# Patient Record
Sex: Male | Born: 1940 | Race: White | Hispanic: No | Smoking: Former smoker
Health system: Southern US, Community
[De-identification: ages and names within clinical notes are randomized; demographics above are authoritative.]

## PROBLEM LIST (undated history)

## (undated) DIAGNOSIS — R569 Unspecified convulsions: Secondary | ICD-10-CM

## (undated) DIAGNOSIS — G20A1 Parkinson's disease without dyskinesia, without mention of fluctuations: Secondary | ICD-10-CM

## (undated) DIAGNOSIS — N390 Urinary tract infection, site not specified: Secondary | ICD-10-CM

## (undated) DIAGNOSIS — F329 Major depressive disorder, single episode, unspecified: Secondary | ICD-10-CM

## (undated) DIAGNOSIS — F0390 Unspecified dementia without behavioral disturbance: Secondary | ICD-10-CM

## (undated) DIAGNOSIS — I10 Essential (primary) hypertension: Secondary | ICD-10-CM

## (undated) DIAGNOSIS — G20 Parkinson's disease: Secondary | ICD-10-CM

## (undated) DIAGNOSIS — F32A Depression, unspecified: Secondary | ICD-10-CM

## (undated) HISTORY — PX: HIP FRACTURE SURGERY: SHX118

---

## 2011-08-07 ENCOUNTER — Encounter: Payer: Self-pay | Admitting: Emergency Medicine

## 2011-08-07 ENCOUNTER — Emergency Department (HOSPITAL_COMMUNITY): Payer: MEDICARE

## 2011-08-07 ENCOUNTER — Emergency Department (HOSPITAL_COMMUNITY)
Admission: EM | Admit: 2011-08-07 | Discharge: 2011-08-07 | Disposition: A | Discharge status: Home / Self Care | Payer: MEDICARE | Attending: Emergency Medicine | Admitting: Emergency Medicine

## 2011-08-07 DIAGNOSIS — S0003XA Contusion of scalp, initial encounter: Secondary | ICD-10-CM | POA: Insufficient documentation

## 2011-08-07 DIAGNOSIS — S0083XA Contusion of other part of head, initial encounter: Secondary | ICD-10-CM

## 2011-08-07 DIAGNOSIS — S0990XA Unspecified injury of head, initial encounter: Secondary | ICD-10-CM | POA: Insufficient documentation

## 2011-08-07 DIAGNOSIS — G40909 Epilepsy, unspecified, not intractable, without status epilepticus: Secondary | ICD-10-CM | POA: Insufficient documentation

## 2011-08-07 DIAGNOSIS — IMO0002 Reserved for concepts with insufficient information to code with codable children: Secondary | ICD-10-CM | POA: Insufficient documentation

## 2011-08-07 DIAGNOSIS — S0181XA Laceration without foreign body of other part of head, initial encounter: Secondary | ICD-10-CM

## 2011-08-07 DIAGNOSIS — Z79899 Other long term (current) drug therapy: Secondary | ICD-10-CM | POA: Insufficient documentation

## 2011-08-07 DIAGNOSIS — G20 Parkinson's disease: Secondary | ICD-10-CM | POA: Insufficient documentation

## 2011-08-07 DIAGNOSIS — R51 Headache: Secondary | ICD-10-CM | POA: Insufficient documentation

## 2011-08-07 DIAGNOSIS — G20A1 Parkinson's disease without dyskinesia, without mention of fluctuations: Secondary | ICD-10-CM | POA: Insufficient documentation

## 2011-08-07 DIAGNOSIS — S0180XA Unspecified open wound of other part of head, initial encounter: Secondary | ICD-10-CM | POA: Insufficient documentation

## 2011-08-07 DIAGNOSIS — W010XXA Fall on same level from slipping, tripping and stumbling without subsequent striking against object, initial encounter: Secondary | ICD-10-CM | POA: Insufficient documentation

## 2011-08-07 DIAGNOSIS — F3289 Other specified depressive episodes: Secondary | ICD-10-CM | POA: Insufficient documentation

## 2011-08-07 DIAGNOSIS — F329 Major depressive disorder, single episode, unspecified: Secondary | ICD-10-CM | POA: Insufficient documentation

## 2011-08-07 HISTORY — DX: Major depressive disorder, single episode, unspecified: F32.9

## 2011-08-07 HISTORY — DX: Parkinson's disease: G20

## 2011-08-07 HISTORY — DX: Depression, unspecified: F32.A

## 2011-08-07 HISTORY — DX: Parkinson's disease without dyskinesia, without mention of fluctuations: G20.A1

## 2011-08-07 HISTORY — DX: Unspecified convulsions: R56.9

## 2011-08-07 NOTE — ED Provider Notes (Signed)
History    70yM presenting after fall. Mechanical. Lost footing getting out of bed. Struck night stand with head. No loc. Minor HA. Denies neck or back pain. No acute visual complaints. No change in mental status per daughter. No acute numbness, weakness or loss of strength. No cp or sob. No abdominal pain or nv/. Forehead lac. Denies use of blood thinning medications.  CSN: [id number] Arrival date & time: 08/07/2011  1:31 AM   First MD Initiated Contact with Patient 08/07/11 0138      Chief Complaint  Patient presents with  . Fall    (Consider location/radiation/quality/duration/timing/severity/associated sxs/prior treatment) HPI  Past Medical History  Diagnosis Date  . Parkinson disease   . Seizures   . Depression     No past surgical history on file.  No family history on file.  History  Substance Use Topics  . Smoking status: Not on file  . Smokeless tobacco: Not on file  . Alcohol Use:       Review of Systems   Review of symptoms negative unless otherwise noted in HPI.   Allergies  Review of patient's allergies indicates no known allergies.  Home Medications   Current Outpatient Rx  Name Route Sig Dispense Refill  . CARBIDOPA-LEVODOPA 25-100 MG PO TABS Oral Take 1 tablet by mouth 4 (four) times daily.      Marland Kitchen DOCUSATE SODIUM 100 MG PO CAPS Oral Take 100 mg by mouth daily.      Marland Kitchen ENTACAPONE 200 MG PO TABS Oral Take 200 mg by mouth 4 (four) times daily.      Marland Kitchen ESCITALOPRAM OXALATE 20 MG PO TABS Oral Take 20 mg by mouth daily.      Marland Kitchen FERROUS SULFATE 325 (65 FE) MG PO TABS Oral Take 325 mg by mouth 2 (two) times daily.      Marland Kitchen MAGNESIUM HYDROXIDE 400 MG/5ML PO SUSP Oral Take 30 mLs by mouth daily as needed. For constipation     . HCA SUPER THERAVITE-M PO Oral Take 1 tablet by mouth daily.      Marland Kitchen POTASSIUM CHLORIDE CRYS CR 20 MEQ PO TBCR Oral Take 20 mEq by mouth daily.      . TOPIRAMATE 50 MG PO TABS Oral Take 50 mg by mouth 2 (two) times daily.      Marland Kitchen  VITAMIN D (ERGOCALCIFEROL) 50000 UNITS PO CAPS Oral Take 50,000 Units by mouth every 7 (seven) days. On mondays       BP 120/64  Pulse 62  Temp(Src) 98.3 F (36.8 C) (Oral)  Resp 20  SpO2 100%  Physical Exam  Nursing note and vitals reviewed. Constitutional: He appears well-developed and well-nourished. No distress.  HENT:  Right Ear: External ear normal.  Left Ear: External ear normal.       2cm horizontal lac R forehead above eyebrow. No active bleeding. Superficial abrasions and contusions to face.  Eyes: Conjunctivae are normal. Pupils are equal, round, and reactive to light. Right eye exhibits no discharge. Left eye exhibits no discharge.  Neck: Neck supple.  Cardiovascular: Normal rate, regular rhythm and normal heart sounds.  Exam reveals no gallop and no friction rub.   Pulmonary/Chest: Effort normal and breath sounds normal. No respiratory distress.  Abdominal: Soft. He exhibits no distension. There is no tenderness.  Musculoskeletal: He exhibits no edema and no tenderness.       No midline spinal tenderness  Neurological: He is alert. No cranial nerve deficit. He exhibits normal muscle tone.  Skin: Skin is warm and dry.  Psychiatric: He has a normal mood and affect. His behavior is normal. Thought content normal.    ED Course  Procedures (including critical care time)  LACERATION REPAIR Performed by: Raeford Razor Authorized by: Raeford Razor Consent: Verbal consent obtained. Risks and benefits: risks, benefits and alternatives were discussed Consent given by: patient Patient identity confirmed: provided demographic data Prepped and Draped in normal sterile fashion Wound explored  Laceration Location: R forehead  Laceration Length: 2 cm  No Foreign Bodies seen or palpated  Anesthesia: local infiltration  Local anesthetic: lidocaine 2% w/\epinephrine  Anesthetic total: 2 ml  Irrigation method: syringe Amount of cleaning: standard  Skin closure: 5-0  monocryl Number of sutures: 4  Technique: simple interupted  Patient tolerance: Patient tolerated the procedure well with no immediate complications.  Labs Reviewed - No data to display Ct Head Wo Contrast  08/07/2011  *RADIOLOGY REPORT*  Clinical Data:  Status post fall; headache and neck pain.  CT HEAD WITHOUT CONTRAST AND CT CERVICAL SPINE WITHOUT CONTRAST  Technique:  Multidetector CT imaging of the head and cervical spine was performed following the standard protocol without intravenous contrast.  Multiplanar CT image reconstructions of the cervical spine were also generated.  Comparison: None  CT HEAD  Findings: There is no evidence of acute infarction, mass lesion, or intra- or extra-axial hemorrhage on CT.  Prominence of the ventricles and sulci reflects mild cortical volume loss.  Mild periventricular white matter change likely reflects small vessel ischemic microangiopathy.  Mild cerebellar atrophy is noted.  Decreased attenuation within the right pons may reflect a small chronic lacunar infarct.  The basal ganglia are unremarkable in appearance.  The cerebral hemispheres demonstrate grossly normal gray-white differentiation. No mass effect or midline shift is seen.  There is no evidence of fracture; visualized osseous structures are unremarkable in appearance.  The orbits are within normal limits. A mucus retention cyst or polyp is noted within the left maxillary sinus; the remaining paranasal sinuses and mastoid air cells are well-aerated.  Mild soft tissue swelling is noted overlying the left frontal calvarium.  IMPRESSION:  1.  No evidence of traumatic intracranial injury or fracture. 2.  Mild soft tissue swelling overlying the left frontal calvarium. 3.  Mild cortical volume loss and scattered small vessel ischemic microangiopathy. 4.  Possible small chronic lacunar infarct within the right pons. 5.  Mucus retention cyst or polyp within the left maxillary sinus.  CT CERVICAL SPINE  Findings:  There is no evidence of fracture or subluxation. Vertebral bodies demonstrate normal height and alignment.  There is mild narrowing of the intervertebral disc spaces at C4-C5 and C6- C7, with small anterior and posterior disc osteophyte complexes. Mild vacuum phenomenon is noted at C5-C6.  Prevertebral soft tissues are within normal limits.  Mild degenerative change is noted about the dens.  The visualized portions of the thyroid gland are unremarkable in appearance.  The visualized lung apices are clear.  No significant soft tissue abnormalities are seen.  IMPRESSION:  1.  No evidence of fracture or subluxation along the cervical spine. 2.  Mild degenerative change at the lower cervical spine.  Original Report Authenticated By: Tonia Ghent, M.D.   Ct Cervical Spine Wo Contrast  08/07/2011  *RADIOLOGY REPORT*  Clinical Data:  Status post fall; headache and neck pain.  CT HEAD WITHOUT CONTRAST AND CT CERVICAL SPINE WITHOUT CONTRAST  Technique:  Multidetector CT imaging of the head and cervical spine was performed following  the standard protocol without intravenous contrast.  Multiplanar CT image reconstructions of the cervical spine were also generated.  Comparison: None  CT HEAD  Findings: There is no evidence of acute infarction, mass lesion, or intra- or extra-axial hemorrhage on CT.  Prominence of the ventricles and sulci reflects mild cortical volume loss.  Mild periventricular white matter change likely reflects small vessel ischemic microangiopathy.  Mild cerebellar atrophy is noted.  Decreased attenuation within the right pons may reflect a small chronic lacunar infarct.  The basal ganglia are unremarkable in appearance.  The cerebral hemispheres demonstrate grossly normal gray-white differentiation. No mass effect or midline shift is seen.  There is no evidence of fracture; visualized osseous structures are unremarkable in appearance.  The orbits are within normal limits. A mucus retention cyst or  polyp is noted within the left maxillary sinus; the remaining paranasal sinuses and mastoid air cells are well-aerated.  Mild soft tissue swelling is noted overlying the left frontal calvarium.  IMPRESSION:  1.  No evidence of traumatic intracranial injury or fracture. 2.  Mild soft tissue swelling overlying the left frontal calvarium. 3.  Mild cortical volume loss and scattered small vessel ischemic microangiopathy. 4.  Possible small chronic lacunar infarct within the right pons. 5.  Mucus retention cyst or polyp within the left maxillary sinus.  CT CERVICAL SPINE  Findings: There is no evidence of fracture or subluxation. Vertebral bodies demonstrate normal height and alignment.  There is mild narrowing of the intervertebral disc spaces at C4-C5 and C6- C7, with small anterior and posterior disc osteophyte complexes. Mild vacuum phenomenon is noted at C5-C6.  Prevertebral soft tissues are within normal limits.  Mild degenerative change is noted about the dens.  The visualized portions of the thyroid gland are unremarkable in appearance.  The visualized lung apices are clear.  No significant soft tissue abnormalities are seen.  IMPRESSION:  1.  No evidence of fracture or subluxation along the cervical spine. 2.  Mild degenerative change at the lower cervical spine.  Original Report Authenticated By: Tonia Ghent, M.D.     1. Head injury, closed   2. Laceration of forehead   3. Facial contusion       MDM  70yM with closed head injury. Nonfocal neuro exam. CTs neg for fx or bleed. Lac closed. Head injury instructions given. Follow-up as needed and for suture removal. Understands signs/symptoms to return for immediate re-eval.        Raeford Razor, MD 08/09/11 [phone]

## 2011-08-07 NOTE — ED Notes (Signed)
[facility] called for pt 

## 2011-08-07 NOTE — ED Notes (Signed)
Assumed care of pt.  pts family member given a blanket and pillow.  Pt resting, no distress noted.  Updated on POC.

## 2011-08-07 NOTE — ED Notes (Signed)
Per EMS; pt fell out of bed, struck side of night stand; pt has minor abrasion to forehead, bleeding control; no vision changes, no LOC, denies pain; pt from [facility]

## 2011-08-14 ENCOUNTER — Encounter (HOSPITAL_COMMUNITY): Payer: Self-pay | Admitting: *Deleted

## 2011-09-23 ENCOUNTER — Encounter (HOSPITAL_COMMUNITY): Payer: Self-pay | Admitting: *Deleted

## 2011-09-23 ENCOUNTER — Emergency Department (HOSPITAL_COMMUNITY)
Admission: EM | Admit: 2011-09-23 | Discharge: 2011-09-23 | Disposition: A | Discharge status: Home / Self Care | Payer: MEDICARE | Attending: Emergency Medicine | Admitting: Emergency Medicine

## 2011-09-23 DIAGNOSIS — R195 Other fecal abnormalities: Secondary | ICD-10-CM

## 2011-09-23 DIAGNOSIS — F329 Major depressive disorder, single episode, unspecified: Secondary | ICD-10-CM | POA: Insufficient documentation

## 2011-09-23 DIAGNOSIS — F3289 Other specified depressive episodes: Secondary | ICD-10-CM | POA: Insufficient documentation

## 2011-09-23 DIAGNOSIS — G40909 Epilepsy, unspecified, not intractable, without status epilepticus: Secondary | ICD-10-CM | POA: Insufficient documentation

## 2011-09-23 DIAGNOSIS — Z79899 Other long term (current) drug therapy: Secondary | ICD-10-CM | POA: Insufficient documentation

## 2011-09-23 DIAGNOSIS — G20 Parkinson's disease: Secondary | ICD-10-CM | POA: Insufficient documentation

## 2011-09-23 DIAGNOSIS — G20A1 Parkinson's disease without dyskinesia, without mention of fluctuations: Secondary | ICD-10-CM | POA: Insufficient documentation

## 2011-09-23 LAB — OCCULT BLOOD, POC DEVICE: Fecal Occult Bld: NEGATIVE

## 2011-09-23 NOTE — ED Provider Notes (Cosign Needed)
History     CSN: [id number]  Arrival date & time 09/23/11  4098   First MD Initiated Contact with Patient 09/23/11 2018      Chief Complaint  Patient presents with  . Rectal Bleeding    (Consider location/radiation/quality/duration/timing/severity/associated sxs/prior treatment) HPI Comments: Patient is a 71 year old man with Parkinson's disease who saw his stools the dark on Monday, 6 days ago. It seemed unusually dark today. He was worried that it could be a reflection of rectal bleeding internally. Therefore he came for evaluation there is no prior history of ulcer disease or diverticulosis. He is under treatment for parkinsonism and for depression. He also takes ferrous sulfate.  Patient is a 71 y.o. male presenting with hematochezia. The history is provided by the patient and a relative. No language interpreter was used.  Rectal Bleeding  The current episode started 5 to 7 days ago. The problem occurs occasionally. The problem has been unchanged. The patient is experiencing no pain. The stool is described as soft. Treatments tried: He's had no prior evaluation or treatment for this condition. Pertinent negatives include no anorexia, no fever, no abdominal pain, no diarrhea, no hematemesis, no hemorrhoids, no nausea, no rectal pain and no vomiting. He has been eating and drinking normally.    Past Medical History  Diagnosis Date  . Parkinson disease   . Seizures   . Depression     History reviewed. No pertinent past surgical history.  History reviewed. No pertinent family history.  History  Substance Use Topics  . Smoking status: Never Smoker   . Smokeless tobacco: Not on file  . Alcohol Use: No      Review of Systems  Constitutional: Negative.  Negative for fever.  HENT: Negative.   Eyes: Negative.   Respiratory: Negative.   Cardiovascular: Negative.   Gastrointestinal: Positive for hematochezia. Negative for nausea, vomiting, abdominal pain, diarrhea, rectal  pain, anorexia, hematemesis and hemorrhoids.       Dark stools.  Genitourinary: Negative.   Musculoskeletal: Negative.   Skin: Negative.   Neurological:       He has slow speech and some muscular rigidity characteristic of parkinsonism.  Psychiatric/Behavioral: Negative.     Allergies  Review of patient's allergies indicates no known allergies.  Home Medications   Current Outpatient Rx  Name Route Sig Dispense Refill  . CARBIDOPA-LEVODOPA 25-100 MG PO TABS Oral Take 1 tablet by mouth 4 (four) times daily.      Marland Kitchen DOCUSATE SODIUM 100 MG PO CAPS Oral Take 100 mg by mouth daily.      Marland Kitchen ESCITALOPRAM OXALATE 20 MG PO TABS Oral Take 20 mg by mouth daily.      Marland Kitchen FERROUS SULFATE 325 (65 FE) MG PO TABS Oral Take 325 mg by mouth 2 (two) times daily.      Marland Kitchen HCA SUPER THERAVITE-M PO Oral Take 1 tablet by mouth daily.     Marland Kitchen POTASSIUM CHLORIDE CRYS ER 20 MEQ PO TBCR Oral Take 20 mEq by mouth daily.      . TOPIRAMATE 50 MG PO TABS Oral Take 50 mg by mouth daily.     Marland Kitchen VITAMIN D (ERGOCALCIFEROL) 50000 UNITS PO CAPS Oral Take 50,000 Units by mouth every 7 (seven) days. On mondays     . MAGNESIUM HYDROXIDE 400 MG/5ML PO SUSP Oral Take 30 mLs by mouth daily as needed. For constipation       BP 121/50  Pulse 60  Temp(Src) 98.4 F (36.9 C) (Oral)  Resp 16  SpO2 98%  Physical Exam  Constitutional: He is oriented to person, place, and time. He appears well-developed and well-nourished. No distress.  HENT:  Head: Normocephalic and atraumatic.  Right Ear: External ear normal.  Left Ear: External ear normal.  Mouth/Throat: Oropharynx is clear and moist.  Eyes: Conjunctivae and EOM are normal. Pupils are equal, round, and reactive to light.  Neck: Normal range of motion. Neck supple.  Cardiovascular: Normal rate, regular rhythm and normal heart sounds.   Pulmonary/Chest: Effort normal and breath sounds normal.  Abdominal: Soft. Bowel sounds are normal.  Genitourinary:       Rectal exam shows no  mass or tenderness. Stool is served are gray in color. There is no obvious blood. Hemoccult testing was negative.  Musculoskeletal: Normal range of motion.  Neurological: He is alert and oriented to person, place, and time.       He has some muscular rigidity, more in the left side. There is no gross sensory or motor deficit.  Skin: Skin is warm and dry.  Psychiatric: He has a normal mood and affect. His behavior is normal.    ED Course  Procedures (including critical care time)   Labs Reviewed  OCCULT BLOOD X 1 CARD TO LAB, STOOL   8:57 PM Patient was seen and had physical exam and Hemoccult of the stool, which was negative. He was reassured and released.   1. Dark stools   2. Parkinsonism            Carleene Cooper III, MD 09/23/11 2059

## 2011-09-23 NOTE — ED Notes (Signed)
Patient went to use the bathroom and noticed his stool to be dark in nature (black).  Patient denies any new symptoms. Denies abdominal pain and dizziness

## 2011-09-23 NOTE — ED Notes (Signed)
Pt is complaining of rectal bleeding, dark stools, increased weakness, and confusion.  Pt has a history of parkinsons and recently had his meds adjusted

## 2012-01-04 ENCOUNTER — Emergency Department (HOSPITAL_COMMUNITY): Payer: Medicare Other

## 2012-01-04 ENCOUNTER — Emergency Department (HOSPITAL_COMMUNITY)
Admission: EM | Admit: 2012-01-04 | Discharge: 2012-01-04 | Disposition: A | Discharge status: Home / Self Care | Payer: Medicare Other | Attending: Emergency Medicine | Admitting: Emergency Medicine

## 2012-01-04 ENCOUNTER — Encounter (HOSPITAL_COMMUNITY): Payer: Self-pay | Admitting: *Deleted

## 2012-01-04 DIAGNOSIS — R4182 Altered mental status, unspecified: Secondary | ICD-10-CM | POA: Insufficient documentation

## 2012-01-04 DIAGNOSIS — M25519 Pain in unspecified shoulder: Secondary | ICD-10-CM | POA: Insufficient documentation

## 2012-01-04 DIAGNOSIS — F329 Major depressive disorder, single episode, unspecified: Secondary | ICD-10-CM | POA: Insufficient documentation

## 2012-01-04 DIAGNOSIS — G20 Parkinson's disease: Secondary | ICD-10-CM | POA: Insufficient documentation

## 2012-01-04 DIAGNOSIS — I498 Other specified cardiac arrhythmias: Secondary | ICD-10-CM | POA: Insufficient documentation

## 2012-01-04 DIAGNOSIS — G20A1 Parkinson's disease without dyskinesia, without mention of fluctuations: Secondary | ICD-10-CM | POA: Insufficient documentation

## 2012-01-04 DIAGNOSIS — I10 Essential (primary) hypertension: Secondary | ICD-10-CM | POA: Insufficient documentation

## 2012-01-04 DIAGNOSIS — F0390 Unspecified dementia without behavioral disturbance: Secondary | ICD-10-CM | POA: Insufficient documentation

## 2012-01-04 DIAGNOSIS — G40909 Epilepsy, unspecified, not intractable, without status epilepticus: Secondary | ICD-10-CM | POA: Insufficient documentation

## 2012-01-04 DIAGNOSIS — R609 Edema, unspecified: Secondary | ICD-10-CM | POA: Insufficient documentation

## 2012-01-04 DIAGNOSIS — M25473 Effusion, unspecified ankle: Secondary | ICD-10-CM | POA: Insufficient documentation

## 2012-01-04 DIAGNOSIS — F3289 Other specified depressive episodes: Secondary | ICD-10-CM | POA: Insufficient documentation

## 2012-01-04 DIAGNOSIS — Z79899 Other long term (current) drug therapy: Secondary | ICD-10-CM | POA: Insufficient documentation

## 2012-01-04 DIAGNOSIS — M25476 Effusion, unspecified foot: Secondary | ICD-10-CM | POA: Insufficient documentation

## 2012-01-04 HISTORY — DX: Urinary tract infection, site not specified: N39.0

## 2012-01-04 HISTORY — DX: Essential (primary) hypertension: I10

## 2012-01-04 HISTORY — DX: Unspecified dementia, unspecified severity, without behavioral disturbance, psychotic disturbance, mood disturbance, and anxiety: F03.90

## 2012-01-04 LAB — CBC
HCT: 39.4 % (ref 39.0–52.0)
Platelets: 199 10*3/uL (ref 150–400)
RBC: 4.37 MIL/uL (ref 4.22–5.81)
RDW: 12.5 % (ref 11.5–15.5)
WBC: 9.2 10*3/uL (ref 4.0–10.5)

## 2012-01-04 LAB — URINE MICROSCOPIC-ADD ON

## 2012-01-04 LAB — URINALYSIS, ROUTINE W REFLEX MICROSCOPIC
Glucose, UA: NEGATIVE mg/dL
Nitrite: NEGATIVE
Specific Gravity, Urine: 1.018 (ref 1.005–1.030)
pH: 7 (ref 5.0–8.0)

## 2012-01-04 LAB — DIFFERENTIAL
Basophils Absolute: 0 10*3/uL (ref 0.0–0.1)
Lymphocytes Relative: 12 % (ref 12–46)
Lymphs Abs: 1.1 10*3/uL (ref 0.7–4.0)
Neutro Abs: 7 10*3/uL (ref 1.7–7.7)

## 2012-01-04 LAB — COMPREHENSIVE METABOLIC PANEL
ALT: 13 U/L (ref 0–53)
AST: 20 U/L (ref 0–37)
Albumin: 3.5 g/dL (ref 3.5–5.2)
Alkaline Phosphatase: 63 U/L (ref 39–117)
Chloride: 105 mEq/L (ref 96–112)
Potassium: 4.1 mEq/L (ref 3.5–5.1)
Sodium: 139 mEq/L (ref 135–145)
Total Bilirubin: 0.5 mg/dL (ref 0.3–1.2)

## 2012-01-04 LAB — POCT I-STAT TROPONIN I: Troponin i, poc: 0.04 ng/mL (ref 0.00–0.08)

## 2012-01-04 NOTE — ED Notes (Signed)
Pt is from [facility]. Son was called out due to pt being aggressive towards staff and not cooperating. Pt is able to tell me that it is April of 2013. Only complaint is right shoulder pain, states staff was rough with him last night. Son has paper that has nursing notes on it. HR verified to be 35 radially. Son states this is not normal, no vitals on paperwork given from nursing staff but son states he was informed all vitals were normal this am. Son states pt has hx of UTI after foley placement. Pt is moving right shoulder, ROM intact.

## 2012-01-04 NOTE — ED Notes (Addendum)
Pt to be transported back to facility. Discharge instructions explained to patient and son. Had no further questions. Facility staff member on the way to hospital to transport patient.

## 2012-01-04 NOTE — Discharge Instructions (Signed)
Joseph [name] can take Tylenol 650 mg every 4 hours as needed for shoulder pain. Urine has been sent for culture. We will phone in a prescription for an antibiotic if it is determined that he has a urinary tract infection. Have him return if his condition worsens for any reason

## 2012-01-04 NOTE — ED Provider Notes (Cosign Needed Addendum)
History     CSN: [id number]  Arrival date & time 01/04/12  7829   First MD Initiated Contact with Patient 01/04/12 1012      Chief Complaint  Patient presents with  . Altered Mental Status    (Consider location/radiation/quality/duration/timing/severity/associated sxs/prior treatment) HPI Level V caveat dementia history is obtained from son. Patient reportedly with argumentative behavior at assisted living facility last night..Normal for this patient. Son reports he is presently acting at baseline while here. Son also reports patient has had swelling of both ankles for the past 2 or 3 days and possibly swelling of his back. Patient's present only complaint is right shoulder pain worse with movement states he was handled roughly by staff at assisted-living facility yesterday when they attempted to change his diaper. No other complaint no treatment prior to coming here Past Medical History  Diagnosis Date  . Parkinson disease   . Seizures   . Depression   . Hypertension   . Dementia   . UTI (lower urinary tract infection)     Past Surgical History  Procedure Date  . Hip fracture surgery     History reviewed. No pertinent family history.  History  Substance Use Topics  . Smoking status: Never Smoker   . Smokeless tobacco: Not on file  . Alcohol Use: No      Review of Systems  Unable to perform ROS: Dementia    Allergies  Review of patient's allergies indicates no known allergies.  Home Medications   Current Outpatient Rx  Name Route Sig Dispense Refill  . CARBIDOPA-LEVODOPA 25-100 MG PO TABS Oral Take 1 tablet by mouth 4 (four) times daily.     Marland Kitchen VITAMIN D 1000 UNITS PO TABS Oral Take 1,000 Units by mouth daily.    Marland Kitchen DOCUSATE SODIUM 100 MG PO CAPS Oral Take 100 mg by mouth daily.      Marland Kitchen ESCITALOPRAM OXALATE 20 MG PO TABS Oral Take 20 mg by mouth daily.      Marland Kitchen FERROUS SULFATE 325 (65 FE) MG PO TABS Oral Take 325 mg by mouth 2 (two) times daily.      Marland Kitchen MAGNESIUM  HYDROXIDE 400 MG/5ML PO SUSP Oral Take 30 mLs by mouth daily as needed. For constipation    . HCA SUPER THERAVITE-M PO Oral Take 1 tablet by mouth daily.     Marland Kitchen POTASSIUM CHLORIDE CRYS ER 20 MEQ PO TBCR Oral Take 20 mEq by mouth daily.     . PSYLLIUM 0.52 G PO CAPS Oral Take 0.52 g by mouth daily.    . TOPIRAMATE 50 MG PO TABS Oral Take 50 mg by mouth daily.       BP 130/43  Pulse 34  Temp(Src) 97.6 F (36.4 C) (Oral)  Resp 16  SpO2 96%  Physical Exam  Nursing note and vitals reviewed. Constitutional:       Chronically ill-appearing  HENT:  Head: Normocephalic and atraumatic.       Mucous membranes dry  Eyes: Conjunctivae are normal. Pupils are equal, round, and reactive to light.  Neck: Neck supple. No tracheal deviation present. No thyromegaly present.  Cardiovascular: Normal rate.   No murmur heard.      Irregular  Pulmonary/Chest: Effort normal and breath sounds normal.  Abdominal: Soft. Bowel sounds are normal. He exhibits no distension. There is no tenderness.  Genitourinary: Penis normal.  Musculoskeletal: Normal range of motion. He exhibits edema and tenderness.       Entire back and spine  without swelling or tenderness minimal edema bilaterally at ankles. Right upper extremity minimally tender at shoulder no deformity or swelling some pain and shoulder with active motion  Neurological: He is alert. Coordination normal.       Moves all extremities follow simple commands  Skin: Skin is warm and dry. No rash noted.  Psychiatric: He has a normal mood and affect.   Date: 01/04/2012  Rate: 85  Rhythm: Ventricular bigeminy  QRS Axis: normal  Intervals: normal  ST/T Wave abnormalities: normal  Conduction Disutrbances:nonspecific intraventricular conduction delay  Narrative Interpretation:   Old EKG Reviewed: none available   ED Course  Procedures (including critical care time)  Labs Reviewed - No data to display No results found.   No diagnosis found.  1:30 PM  patient continues to look at baseline per his son  MDM  Low heart rate likely factitious. Plan urine sent for culture Pain shoulder felt to be muscular. He may take Tylenol as needed for shoulder pain Patient may have suffered delirium which resolved Diagnosis #1 dementia #2 ventricular bigeminy #3 right shoulder pain        Doug Sou, MD 01/04/12 1334  Doug Sou, MD 01/04/12 1719

## 2012-01-05 LAB — URINE CULTURE: Culture  Setup Time: 201304261405

## 2012-05-14 ENCOUNTER — Emergency Department (HOSPITAL_COMMUNITY): Payer: Medicare Other

## 2012-05-14 ENCOUNTER — Encounter (HOSPITAL_COMMUNITY): Payer: Self-pay | Admitting: *Deleted

## 2012-05-14 ENCOUNTER — Emergency Department (HOSPITAL_COMMUNITY)
Admission: EM | Admit: 2012-05-14 | Discharge: 2012-05-14 | Disposition: A | Discharge status: Skilled Nursing Facility | Payer: Medicare Other | Attending: Emergency Medicine | Admitting: Emergency Medicine

## 2012-05-14 DIAGNOSIS — Z79899 Other long term (current) drug therapy: Secondary | ICD-10-CM | POA: Insufficient documentation

## 2012-05-14 DIAGNOSIS — I10 Essential (primary) hypertension: Secondary | ICD-10-CM | POA: Insufficient documentation

## 2012-05-14 DIAGNOSIS — G20A1 Parkinson's disease without dyskinesia, without mention of fluctuations: Secondary | ICD-10-CM | POA: Insufficient documentation

## 2012-05-14 DIAGNOSIS — F0390 Unspecified dementia without behavioral disturbance: Secondary | ICD-10-CM | POA: Insufficient documentation

## 2012-05-14 DIAGNOSIS — G20 Parkinson's disease: Secondary | ICD-10-CM | POA: Insufficient documentation

## 2012-05-14 DIAGNOSIS — F329 Major depressive disorder, single episode, unspecified: Secondary | ICD-10-CM | POA: Insufficient documentation

## 2012-05-14 DIAGNOSIS — F3289 Other specified depressive episodes: Secondary | ICD-10-CM | POA: Insufficient documentation

## 2012-05-14 LAB — CBC WITH DIFFERENTIAL/PLATELET
Eosinophils Relative: 5 % (ref 0–5)
HCT: 38.7 % — ABNORMAL LOW (ref 39.0–52.0)
Hemoglobin: 13.1 g/dL (ref 13.0–17.0)
Lymphocytes Relative: 18 % (ref 12–46)
Lymphs Abs: 1.3 10*3/uL (ref 0.7–4.0)
MCV: 91.1 fL (ref 78.0–100.0)
Monocytes Absolute: 0.8 10*3/uL (ref 0.1–1.0)
Monocytes Relative: 11 % (ref 3–12)
Neutro Abs: 4.7 10*3/uL (ref 1.7–7.7)
RBC: 4.25 MIL/uL (ref 4.22–5.81)
RDW: 12.8 % (ref 11.5–15.5)
WBC: 7.1 10*3/uL (ref 4.0–10.5)

## 2012-05-14 LAB — URINALYSIS, ROUTINE W REFLEX MICROSCOPIC
Bilirubin Urine: NEGATIVE
Hgb urine dipstick: NEGATIVE
Nitrite: NEGATIVE
Protein, ur: NEGATIVE mg/dL
Specific Gravity, Urine: 1.023 (ref 1.005–1.030)
Urobilinogen, UA: 1 mg/dL (ref 0.0–1.0)

## 2012-05-14 LAB — COMPREHENSIVE METABOLIC PANEL
CO2: 24 mEq/L (ref 19–32)
Calcium: 9 mg/dL (ref 8.4–10.5)
Chloride: 103 mEq/L (ref 96–112)
Creatinine, Ser: 0.8 mg/dL (ref 0.50–1.35)
GFR calc Af Amer: >90 mL/min (ref >90)
GFR calc non Af Amer: 87 mL/min — ABNORMAL LOW (ref >90)
Glucose, Bld: 97 mg/dL (ref 70–99)
Total Bilirubin: 0.4 mg/dL (ref 0.3–1.2)

## 2012-05-14 LAB — TROPONIN I: Troponin I: <0.3 ng/mL (ref <0.30)

## 2012-05-14 NOTE — ED Provider Notes (Signed)
History     CSN: [id number]  Arrival date & time 05/14/12  1101   First MD Initiated Contact with Patient 05/14/12 1111      Chief Complaint  Patient presents with  . Extremity Weakness    (Consider location/radiation/quality/duration/timing/severity/associated sxs/prior treatment) HPI Pt sent from nursing home for increased weakness. Pt is wheelchair-bound and get PT. He has no complaints. History of parkinson's disease.  Past Medical History  Diagnosis Date  . Parkinson disease   . Seizures   . Depression   . Hypertension   . Dementia   . UTI (lower urinary tract infection)     Past Surgical History  Procedure Date  . Hip fracture surgery     No family history on file.  History  Substance Use Topics  . Smoking status: Never Smoker   . Smokeless tobacco: Not on file  . Alcohol Use: No      Review of Systems  Constitutional: Negative for fever and chills.  Respiratory: Negative for cough and shortness of breath.   Cardiovascular: Negative for chest pain, palpitations and leg swelling.  Gastrointestinal: Negative for nausea, vomiting, abdominal pain, diarrhea and constipation.  Genitourinary: Negative for dysuria, frequency and difficulty urinating.  Skin: Negative for pallor, rash and wound.  Neurological: Positive for weakness. Negative for dizziness, syncope, light-headedness and numbness.    Allergies  Review of patient's allergies indicates no known allergies.  Home Medications   Current Outpatient Rx  Name Route Sig Dispense Refill  . CARBIDOPA-LEVODOPA 25-100 MG PO TABS Oral Take 1 tablet by mouth 4 (four) times daily.     Marland Kitchen VITAMIN D 1000 UNITS PO TABS Oral Take 1,000 Units by mouth daily.    Marland Kitchen DOCUSATE SODIUM 100 MG PO CAPS Oral Take 100 mg by mouth daily.      Marland Kitchen ESCITALOPRAM OXALATE 20 MG PO TABS Oral Take 20 mg by mouth daily.      Marland Kitchen FERROUS SULFATE 325 (65 FE) MG PO TABS Oral Take 325 mg by mouth 2 (two) times daily.      . ADULT MULTIVITAMIN  W/MINERALS CH Oral Take 1 tablet by mouth daily.    . DOUBLE ANTIBIOTIC 500-10000 UNIT/GM EX OINT Topical Apply 1 application topically daily. Applied to affected area.    Marland Kitchen POTASSIUM CHLORIDE CRYS ER 20 MEQ PO TBCR Oral Take 20 mEq by mouth daily.     . PSYLLIUM 0.52 G PO CAPS Oral Take 0.52 g by mouth daily.    Marland Kitchen MAGNESIUM HYDROXIDE 400 MG/5ML PO SUSP Oral Take 30 mLs by mouth daily as needed. For constipation      BP 127/64  Pulse 61  Temp 98.6 F (37 C) (Oral)  Resp 22  SpO2 98%  Physical Exam  Nursing note and vitals reviewed. Constitutional: He is oriented to person, place, and time. He appears well-developed and well-nourished. No distress.  HENT:  Head: Normocephalic and atraumatic.  Mouth/Throat: Oropharynx is clear and moist.  Eyes: EOM are normal. Pupils are equal, round, and reactive to light.  Neck: Normal range of motion. Neck supple.  Cardiovascular: Normal rate and regular rhythm.   Pulmonary/Chest: Effort normal and breath sounds normal. No respiratory distress. He has no wheezes. He has no rales.  Abdominal: Soft. Bowel sounds are normal. There is no tenderness. There is no rebound and no guarding.  Musculoskeletal: Normal range of motion. He exhibits no edema and no tenderness.  Neurological: He is alert and oriented to person, place, and time.  5/5 strength in all ext. Pt has difficulty initiating movement and has rigidity LE>UE but no appreciable weakness. No gross sensory loss  Skin: Skin is warm and dry. No rash noted. No erythema.  Psychiatric: He has a normal mood and affect. His behavior is normal.    ED Course  Procedures (including critical care time)  Labs Reviewed  CBC WITH DIFFERENTIAL - Abnormal; Notable for the following:    HCT 38.7 (*)     All other components within normal limits  COMPREHENSIVE METABOLIC PANEL - Abnormal; Notable for the following:    Albumin 3.3 (*)     GFR calc non Af Amer 87 (*)     All other components within  normal limits  URINALYSIS, ROUTINE W REFLEX MICROSCOPIC - Abnormal; Notable for the following:    Color, Urine AMBER (*)  BIOCHEMICALS MAY BE AFFECTED BY COLOR   All other components within normal limits  TROPONIN I   Dg Chest Port 1 View  05/14/2012  *RADIOLOGY REPORT*  Clinical Data: Weakness, shortness of breath  PORTABLE CHEST - 1 VIEW  Comparison: None  Findings: There is cardiomegaly present and there may be very minimal pulmonary vascular congestion present.  Small effusions cannot be excluded.  No acute bony abnormality is seen.  There is a thoracic curvature convex to the right.  IMPRESSION: Mild cardiomegaly.  Question mild interstitial edema.   Original Report Authenticated By: Juline Patch, M.D.      1. Parkinson's disease      Date: 05/14/2012  Rate: 59  Rhythm: normal sinus rhythm  QRS Axis: normal  Intervals: normal  ST/T Wave abnormalities: normal  Conduction Disutrbances:none  Narrative Interpretation:   Old EKG Reviewed: unchanged Frequent PVC's seen on prev EKG   MDM   Work up negative in ED. Suspect worsening parkinson responsible for symptoms. Will D/C back to NH and f/u with PMD.        Loren Racer, MD 05/14/12 1446

## 2012-05-14 NOTE — ED Notes (Signed)
Per EMS: pt from [facility], hx of left femur fracture 1 year ago. Staff reports pt's lower extremities noticeably weaker/ decreased in strength. Hx of Parkinson's disease, htn, dementia, constipation, L femur fx, depression. Hx of falls, hx of UTI. bp 110/60 pulse 60, respirations 20, cbg 91, pt denies any pain. Uses wheelchair at facility Pt alert, oriented to person

## 2012-05-14 NOTE — ED Notes (Signed)
[id number]> Expected date:<BR> Expected time:<BR> Means of arrival:<BR> Comments:<BR> Can&#39;t ambulate, 71yo

## 2012-05-14 NOTE — ED Notes (Signed)
[facility]urse from [facility] called to check on pt

## 2012-05-14 NOTE — ED Notes (Signed)
Pt son/POA called, reports he talked to pt on phone last night was having delusional thoughts - Still having delusional thoughts. Pt had urologist apt 1 week ago, heart rate was 36 and possible a-flutter - recommended to follow up with cardiologist. Will notify MD  [phone] POA

## 2012-05-22 ENCOUNTER — Observation Stay (HOSPITAL_COMMUNITY)
Admission: EM | Admit: 2012-05-22 | Discharge: 2012-05-29 | Disposition: A | Discharge status: Skilled Nursing Facility | Payer: Medicare Other | Attending: Family Medicine | Admitting: Family Medicine

## 2012-05-22 ENCOUNTER — Emergency Department (HOSPITAL_COMMUNITY): Payer: Medicare Other

## 2012-05-22 ENCOUNTER — Encounter (HOSPITAL_COMMUNITY): Payer: Self-pay | Admitting: Emergency Medicine

## 2012-05-22 DIAGNOSIS — R001 Bradycardia, unspecified: Secondary | ICD-10-CM

## 2012-05-22 DIAGNOSIS — R4182 Altered mental status, unspecified: Secondary | ICD-10-CM | POA: Insufficient documentation

## 2012-05-22 DIAGNOSIS — G20A1 Parkinson's disease without dyskinesia, without mention of fluctuations: Principal | ICD-10-CM | POA: Insufficient documentation

## 2012-05-22 DIAGNOSIS — I493 Ventricular premature depolarization: Secondary | ICD-10-CM

## 2012-05-22 DIAGNOSIS — I5030 Unspecified diastolic (congestive) heart failure: Secondary | ICD-10-CM | POA: Insufficient documentation

## 2012-05-22 DIAGNOSIS — F0280 Dementia in other diseases classified elsewhere without behavioral disturbance: Secondary | ICD-10-CM | POA: Insufficient documentation

## 2012-05-22 DIAGNOSIS — J811 Chronic pulmonary edema: Secondary | ICD-10-CM | POA: Insufficient documentation

## 2012-05-22 DIAGNOSIS — Z993 Dependence on wheelchair: Secondary | ICD-10-CM | POA: Insufficient documentation

## 2012-05-22 DIAGNOSIS — G20 Parkinson's disease: Secondary | ICD-10-CM

## 2012-05-22 DIAGNOSIS — I498 Other specified cardiac arrhythmias: Secondary | ICD-10-CM | POA: Insufficient documentation

## 2012-05-22 DIAGNOSIS — F22 Delusional disorders: Secondary | ICD-10-CM | POA: Insufficient documentation

## 2012-05-22 DIAGNOSIS — F0390 Unspecified dementia without behavioral disturbance: Secondary | ICD-10-CM

## 2012-05-22 DIAGNOSIS — I10 Essential (primary) hypertension: Secondary | ICD-10-CM | POA: Insufficient documentation

## 2012-05-22 LAB — CBC WITH DIFFERENTIAL/PLATELET
Basophils Absolute: 0 10*3/uL (ref 0.0–0.1)
Basophils Relative: 0 % (ref 0–1)
Eosinophils Relative: 4 % (ref 0–5)
HCT: 38.7 % — ABNORMAL LOW (ref 39.0–52.0)
Lymphocytes Relative: 19 % (ref 12–46)
MCHC: 34.1 g/dL (ref 30.0–36.0)
MCV: 91.5 fL (ref 78.0–100.0)
Monocytes Absolute: 0.7 10*3/uL (ref 0.1–1.0)
Platelets: 194 10*3/uL (ref 150–400)
RDW: 13 % (ref 11.5–15.5)
WBC: 6.4 10*3/uL (ref 4.0–10.5)

## 2012-05-22 LAB — COMPREHENSIVE METABOLIC PANEL
ALT: 12 U/L (ref 0–53)
AST: 15 U/L (ref 0–37)
Albumin: 3.5 g/dL (ref 3.5–5.2)
Calcium: 9.4 mg/dL (ref 8.4–10.5)
Creatinine, Ser: 0.81 mg/dL (ref 0.50–1.35)
Sodium: 138 mEq/L (ref 135–145)
Total Protein: 6.5 g/dL (ref 6.0–8.3)

## 2012-05-22 LAB — URINALYSIS, ROUTINE W REFLEX MICROSCOPIC
Bilirubin Urine: NEGATIVE
Glucose, UA: NEGATIVE mg/dL
Hgb urine dipstick: NEGATIVE
Specific Gravity, Urine: 1.015 (ref 1.005–1.030)
Urobilinogen, UA: 0.2 mg/dL (ref 0.0–1.0)
pH: 6 (ref 5.0–8.0)

## 2012-05-22 LAB — MAGNESIUM: Magnesium: 2.2 mg/dL (ref 1.5–2.5)

## 2012-05-22 MED ORDER — POTASSIUM CHLORIDE CRYS ER 20 MEQ PO TBCR
20.0000 meq | EXTENDED_RELEASE_TABLET | Freq: Every day | ORAL | Status: DC
  Administered 2012-05-23 – 2012-05-29 (×7): 20 meq via ORAL
  Filled 2012-05-22 (×7): qty 1

## 2012-05-22 MED ORDER — ACETAMINOPHEN 650 MG RE SUPP: 650.0000 mg | Freq: Four times a day (QID) | RECTAL | Status: DC | PRN

## 2012-05-22 MED ORDER — PSYLLIUM 95 % PO PACK
1.0000 | PACK | Freq: Every day | ORAL | Status: DC
Start: 2012-05-23 — End: 2012-05-29
  Administered 2012-05-23 – 2012-05-29 (×7): 1 via ORAL
  Filled 2012-05-22 (×7): qty 1

## 2012-05-22 MED ORDER — CARBIDOPA-LEVODOPA 25-100 MG PO TABS
1.0000 | ORAL_TABLET | Freq: Four times a day (QID) | ORAL | Status: DC
  Administered 2012-05-22 – 2012-05-29 (×23): 1 via ORAL
  Filled 2012-05-22 (×29): qty 1

## 2012-05-22 MED ORDER — PSYLLIUM 0.52 G PO CAPS
0.5200 g | ORAL_CAPSULE | Freq: Every day | ORAL | Status: DC
Start: 2012-05-22 — End: 2012-05-22

## 2012-05-22 MED ORDER — ACETAMINOPHEN 325 MG PO TABS
650.0000 mg | ORAL_TABLET | Freq: Four times a day (QID) | ORAL | Status: DC | PRN
  Filled 2012-05-22: qty 2

## 2012-05-22 MED ORDER — SODIUM CHLORIDE 0.9 % IJ SOLN
3.0000 mL | Freq: Two times a day (BID) | INTRAMUSCULAR | Status: DC
  Administered 2012-05-22 – 2012-05-25 (×5): 3 mL via INTRAVENOUS

## 2012-05-22 MED ORDER — ADULT MULTIVITAMIN W/MINERALS CH
1.0000 | ORAL_TABLET | Freq: Every day | ORAL | Status: DC
Start: 2012-05-23 — End: 2012-05-29
  Administered 2012-05-23 – 2012-05-29 (×7): 1 via ORAL
  Filled 2012-05-22 (×7): qty 1

## 2012-05-22 MED ORDER — ENOXAPARIN SODIUM 40 MG/0.4ML ~~LOC~~ SOLN
40.0000 mg | SUBCUTANEOUS | Status: DC
  Administered 2012-05-23 – 2012-05-29 (×5): 40 mg via SUBCUTANEOUS
  Filled 2012-05-22 (×10): qty 0.4

## 2012-05-22 MED ORDER — VITAMIN D3 25 MCG (1000 UNIT) PO TABS
1000.0000 [IU] | ORAL_TABLET | Freq: Every day | ORAL | Status: DC
  Administered 2012-05-23 – 2012-05-29 (×7): 1000 [IU] via ORAL
  Filled 2012-05-22 (×7): qty 1

## 2012-05-22 MED ORDER — ESCITALOPRAM OXALATE 20 MG PO TABS
20.0000 mg | ORAL_TABLET | Freq: Every day | ORAL | Status: DC
  Administered 2012-05-23 – 2012-05-29 (×7): 20 mg via ORAL
  Filled 2012-05-22 (×7): qty 1

## 2012-05-22 MED ORDER — DOCUSATE SODIUM 100 MG PO CAPS
100.0000 mg | ORAL_CAPSULE | Freq: Every day | ORAL | Status: DC
  Administered 2012-05-24 – 2012-05-29 (×5): 100 mg via ORAL
  Filled 2012-05-22 (×6): qty 1

## 2012-05-22 NOTE — ED Provider Notes (Signed)
1540 report recieved from [facility].  I  will disposition patient when CT head and urine comes back.  1630 patient is having bradycardia with altered mental status we will get him admitted unassigned to the hospital. Dr. Ignacia Palma agrees. Teaching service is capped will repage for unassigned.    1700 patient will be admitted to Dr. Sheffield Slider service for family practice for bradycardia and altered mental status. Temporary orders have been put in for a telemetry bed.   Labs Reviewed  CBC WITH DIFFERENTIAL - Abnormal; Notable for the following:    HCT 38.7 (*)     All other components within normal limits  COMPREHENSIVE METABOLIC PANEL - Abnormal; Notable for the following:    GFR calc non Af Amer 87 (*)     All other components within normal limits  BASIC METABOLIC PANEL - Abnormal; Notable for the following:    Glucose, Bld 117 (*)     All other components within normal limits  CBC - Abnormal; Notable for the following:    RBC 4.05 (*)     Hemoglobin 12.6 (*)     HCT 36.7 (*)     All other components within normal limits  PRO B NATRIURETIC PEPTIDE - Abnormal; Notable for the following:    Pro B Natriuretic peptide (BNP) 2460.0 (*)     All other components within normal limits  URINALYSIS, ROUTINE W REFLEX MICROSCOPIC  TROPONIN I  MRSA PCR SCREENING  MAGNESIUM  TROPONIN I  URINE CULTURE    Remi Haggard, NP 05/23/12 1239

## 2012-05-22 NOTE — ED Notes (Addendum)
Admitting MD at bedside. Aware of pt presently in bigeminy

## 2012-05-22 NOTE — ED Notes (Signed)
Patient lives in nursing home family states increased confusion and difficult to arouse. Staff indicated onset today at 1130 intermittently.  EMS transported patient stated patient confused history of dementia.

## 2012-05-22 NOTE — ED Notes (Signed)
Pt denies any pain, SOB. A&OX3. Pt son reports noticed worsening confusion since yesterday. Speech clear, no facial droop. Denies any recent falls. No cold, cough, fever.

## 2012-05-22 NOTE — Progress Notes (Signed)
Admitted pt from ED Pt in SB with Bigeminal PJC's other wise stable.to Room 3302

## 2012-05-22 NOTE — H&P (Signed)
Joseph [name] is an 71 y.o. male.   Chief Complaint: Altered mental status HPI:  Joseph [name] is 71 y.o caucasian male with a history of Parkinson disease, dementia, seizures, depression, hypertension and recent UTI. He presented to the ED today after he had some  hallucinations and episodes of paranoia at his Senior living environment ([facility]). The staff had called his Son to inform him of his fathers sudden decline in mental status. The staff reported he was seeing people that were not in the room and people that have passed away. He also was seeing the wall break away. His son reports his father has been declining mentally and physically since his his (patient's) wife passed away a little over a year ago. At that time he was living on his own and fell and broke his hip. After the hip surgery he had a rapid decline in health and mental function. He was diagnosed with parkinson's at that time and placed on a medication for it. He had a period in January where he was doing mentally better and was backed off some of his medications. Since then he again has declined.  He was seen in the ED last month and was diagnosed with a UTI. He has two urinalysis since then and they were negative. He was seen for a follow up to this with the urologist on the 27th of August and he was noted to have a heart rate in the 30's there. He was seen last week in [facility] for altered mental status and sent home. He improved until today.  Past Medical History  Diagnosis Date  . Parkinson disease   . Seizures   . Depression   . Hypertension   . Dementia   . UTI (lower urinary tract infection)     Past Surgical History  Procedure Date  . Hip fracture surgery     No family history on file. Social History:  reports that he has never smoked. He does not have any smokeless tobacco history on file. He reports that he does not drink alcohol or use illicit drugs.  Allergies: No Known Allergies   (Not in a hospital  admission)  Results for orders placed during the hospital encounter of 05/22/12 (from the past 48 hour(s))  CBC WITH DIFFERENTIAL     Status: Abnormal   Collection Time   05/22/12  2:00 PM      Component Value Range Comment   WBC 6.4  4.0 - 10.5 K/uL    RBC 4.23  4.22 - 5.81 MIL/uL    Hemoglobin 13.2  13.0 - 17.0 g/dL    HCT 16.1 (*) 09.6 - 52.0 %    MCV 91.5  78.0 - 100.0 fL    MCH 31.2  26.0 - 34.0 pg    MCHC 34.1  30.0 - 36.0 g/dL    RDW 04.5  40.9 - 81.1 %    Platelets 194  150 - 400 K/uL    Neutrophils Relative 66  43 - 77 %    Neutro Abs 4.2  1.7 - 7.7 K/uL    Lymphocytes Relative 19  12 - 46 %    Lymphs Abs 1.2  0.7 - 4.0 K/uL    Monocytes Relative 10  3 - 12 %    Monocytes Absolute 0.7  0.1 - 1.0 K/uL    Eosinophils Relative 4  0 - 5 %    Eosinophils Absolute 0.3  0.0 - 0.7 K/uL    Basophils  Relative 0  0 - 1 %    Basophils Absolute 0.0  0.0 - 0.1 K/uL   COMPREHENSIVE METABOLIC PANEL     Status: Abnormal   Collection Time   05/22/12  2:00 PM      Component Value Range Comment   Sodium 138  135 - 145 mEq/L    Potassium 4.4  3.5 - 5.1 mEq/L    Chloride 104  96 - 112 mEq/L    CO2 26  19 - 32 mEq/L    Glucose, Bld 91  70 - 99 mg/dL    BUN 17  6 - 23 mg/dL    Creatinine, Ser 4.09  0.50 - 1.35 mg/dL    Calcium 9.4  8.4 - 81.1 mg/dL    Total Protein 6.5  6.0 - 8.3 g/dL    Albumin 3.5  3.5 - 5.2 g/dL    AST 15  0 - 37 U/L    ALT 12  0 - 53 U/L    Alkaline Phosphatase 61  39 - 117 U/L    Total Bilirubin 0.4  0.3 - 1.2 mg/dL    GFR calc non Af Amer 87 (*) >90 mL/min    GFR calc Af Amer >90  >90 mL/min   URINALYSIS, ROUTINE W REFLEX MICROSCOPIC     Status: Normal   Collection Time   05/22/12  3:07 PM      Component Value Range Comment   Color, Urine YELLOW  YELLOW    APPearance CLEAR  CLEAR    Specific Gravity, Urine 1.015  1.005 - 1.030    pH 6.0  5.0 - 8.0    Glucose, UA NEGATIVE  NEGATIVE mg/dL    Hgb urine dipstick NEGATIVE  NEGATIVE    Bilirubin Urine NEGATIVE   NEGATIVE    Ketones, ur NEGATIVE  NEGATIVE mg/dL    Protein, ur NEGATIVE  NEGATIVE mg/dL    Urobilinogen, UA 0.2  0.0 - 1.0 mg/dL    Nitrite NEGATIVE  NEGATIVE    Leukocytes, UA NEGATIVE  NEGATIVE MICROSCOPIC NOT DONE ON URINES WITH NEGATIVE PROTEIN, BLOOD, LEUKOCYTES, NITRITE, OR GLUCOSE <1000 mg/dL.   Ct Head Wo Contrast  05/22/2012  *RADIOLOGY REPORT*  Clinical Data: Confusion, decreased level of consciousness, difficult to arouse  CT HEAD WITHOUT CONTRAST  Technique:  Contiguous axial images were obtained from the base of the skull through the vertex without contrast.  Comparison: 01/04/2012  Findings: Diffuse brain atrophy evident involving the cerebrum and the cerebellum.  No acute intracranial hemorrhage, mass lesion, focal edema, definite acute infarction, midline shift, herniation, hydrocephalus, or extra-axial fluid collection.  Cisterns patent. Mastoid sinuses clear.  Exam is limited with some motion artifact.  IMPRESSION: Stable brain atrophy without acute process by noncontrast CT.   Original Report Authenticated By: Judie Petit. Ruel Favors, M.D.    Dg Chest Portable 1 View  05/22/2012  *RADIOLOGY REPORT*  Clinical Data: Altered mental status with history of Parkinson's disease.  Dementia.  PORTABLE CHEST - 1 VIEW  Comparison: 05/14/2012  Findings: Cardiomegaly.  Mild edema is stable.  No infiltrates or overt failure.  Osteopenia.  No effusion or pneumothorax.  IMPRESSION: Cardiomegaly with mild edema is stable.   Original Report Authenticated By: Elsie Stain, M.D.     Review of Systems  Constitutional: Negative for fever, chills and weight loss.  Respiratory: Negative for cough, shortness of breath and wheezing.   Cardiovascular: Negative for chest pain.  Gastrointestinal: Positive for constipation. Negative for nausea, vomiting, abdominal  pain and diarrhea.  Genitourinary: Negative for dysuria, urgency and frequency.  Musculoskeletal: Negative for myalgias and joint pain.    Neurological: Positive for dizziness and speech change. Negative for weakness and headaches.  Psychiatric/Behavioral: Positive for hallucinations.  All other systems reviewed and are negative.    Blood pressure 147/68, pulse 55, temperature 98 F (36.7 C), temperature source Oral, resp. rate 15, SpO2 98.00%. Physical Exam  GEN: Oriented x4. Date of September was incorrect but everything else correct. Sleepy, but wakes to answer questions. Appropriate. HEENT:.AT. [place]. Basal Cell Carcinoma incision site on right periorbital region.  PERLA. EOMI. TM: WNL. Throat: No erythema. CV: Bradycardic. Down to 31, while in the room. Episodes of Bigeminy noted on monitor. No murmur. Rubs. Gallops. LUNGS:Ctab. No wheeze, rales, rhonchi. ABD: soft. NT. ND EXT: Bilateral legs are contracted. 5/5 muscle strength UE/LE. Pulses +2/4. Rigidity in left arm>right. NEURO: Oriented x4. CN 2-12 intact. Resting tremor.  Assessment/Plan Delton Stelle is 71 y.o caucasian male with a history of Parkinson disease, dementia, seizures, depression, hypertension and recent UTI. He presented to the ED today after he had some  hallucinations and episodes of paranoia with agitation.  1. Bradycardia: - Admit for Inpatient on Telemetry  - BNP, troponin, urince culture pending - EKG Pending - Orthostatic BP's - Cards consult - CT: Stable brain atrophy without acute process by noncontrast CT.  2. Parkinson disease/dementia: - Continue Home meds  3. FENGI: - heart healthy diet - Tylenol PRN - Zofran PRN - Colace PRN - Metamucil PPX: Lovenox Code: Full Code. Some question on NG tube placement and other details. Son will bring in living will.  DISPO: Pending cardiac stabilization and return to baseline mental status.  Felix Pacini 05/22/2012, 5:17 PM  Reviewed Dr. Sammuel Cooper note and agree with plan. Briefly, Mr. Dantes is a 71 yo male with Parkinson's and dementia who presents with a 2 day history of worsening  confusion, paranoia and hallucinations compared to his baseline. He is accompanied by his son who states that this is similar to when he was hospitalized for hip fracture last year. At baseline, he does have some forgetfullness, but overall the last couple of days, his baseline dementia appears to have been more exaggerated. Son denies any chest pain, shortness of breath, recent diarrhea, constipation, dysuria.  In the ED, patient was found to be bradycardic in the 30-40's. A similar finding occurred at his urologist's office at the end of August.   Physical exam:  General: alert and oriented to person, place, year, month and president, no acute distress HEENT: PERRLA, EOMI, moist mucous membranes, oropharynx clear, TM difficult to visualize given cerumen Neck: supple CV: S1S2, irregular, no murmur Pulm: cta, normal resp effort GI: no tenderness to palpation, soft, no guarding or rebound Ext: trace edema Neuro: CN2-12 grossly intact, 5/5 strength in upper and lower extremities bilaterally, left arm more rigid than right.   A/P: 71 yo male with h/o Parkinson's who presents with what appears to be acute onset delirium on chronic dementia. no new changes in meds that could be explaining delirium. No clear infectious etiology for delirium given normal UA, CXR and normal WBC. Will  send UA to culture nonethless.  Bradycardia with 1st degree AV block and bigeminy, present in previous EKG's from recent visits. Patient is asymptomatic with stable BP. First troponin normal. Will check follow up troponin and morning EKG. Continue to monitor and consult cards in morning. Will continue chronic meds.    Marena Chancy, PGY-2  [facility]

## 2012-05-22 NOTE — Progress Notes (Signed)
71 year old man with dementia and parkinsonism, felt to have had worsening of his mental status in the past 24 hours.  Exam shows him to be an elderly man who is awake but does not converse.  Heart and lungs clear.  Abdomen soft and nontender.  EKG shows frequent PVC's.  CBC, chemistries, UA, chest x-ray and CT of head unchanged.  Review of cardiac monitoring shows episodes of bradycardia into the 30's.  He has not had cardiology evaluation for these episodes of bradycardia.  Recommend admission for observation on monitor, ? Cardiology consultation.

## 2012-05-22 NOTE — ED Notes (Signed)
Report received, assumed care.  

## 2012-05-22 NOTE — ED Provider Notes (Signed)
2:37 PM  Date: 05/22/2012  Rate: 68  Rhythm: normal sinus rhythm and premature ventricular contractions (PVC)and muscle tremor artifact.  QRS Axis: left  Intervals: PR prolonged  ST/T Wave abnormalities: normal  Conduction Disutrbances:nonspecific intraventricular conduction delay  Narrative Interpretation: Abnormal EKG  Old EKG Reviewed: unchanged    Carleene Cooper III, MD 05/22/12 1438

## 2012-05-22 NOTE — ED Notes (Signed)
Attempted to call report to [phone] - nurse unavailable - will call back

## 2012-05-22 NOTE — ED Provider Notes (Signed)
History     CSN: [id number]  Arrival date & time 05/22/12  1330   First MD Initiated Contact with Patient 05/22/12 1421      Chief Complaint  Patient presents with  . Altered Mental Status    (Consider location/radiation/quality/duration/timing/severity/associated sxs/prior treatment) HPI  The patient is 71 yo male with a history of parkinson's disease and dementia that presents to the ED with altered mental status.  The patient is a nursing home resident and his family noticed him becoming confused and difficult to arouse.  This has been going on intermittently since 1130 am.  Level 5 caveat.     Past Medical History  Diagnosis Date  . Parkinson disease   . Seizures   . Depression   . Hypertension   . Dementia   . UTI (lower urinary tract infection)     Past Surgical History  Procedure Date  . Hip fracture surgery     No family history on file.  History  Substance Use Topics  . Smoking status: Never Smoker   . Smokeless tobacco: Not on file  . Alcohol Use: No      Review of Systems  Unable to perform ROS: Dementia    Allergies  Review of patient's allergies indicates no known allergies.  Home Medications   Current Outpatient Rx  Name Route Sig Dispense Refill  . CARBIDOPA-LEVODOPA 25-100 MG PO TABS Oral Take 1 tablet by mouth 4 (four) times daily.     Marland Kitchen VITAMIN D 1000 UNITS PO TABS Oral Take 1,000 Units by mouth daily.    Marland Kitchen DOCUSATE SODIUM 100 MG PO CAPS Oral Take 100 mg by mouth daily.      Marland Kitchen ESCITALOPRAM OXALATE 20 MG PO TABS Oral Take 20 mg by mouth daily.      Marland Kitchen FERROUS SULFATE 325 (65 FE) MG PO TABS Oral Take 325 mg by mouth 2 (two) times daily.      Marland Kitchen MAGNESIUM HYDROXIDE 400 MG/5ML PO SUSP Oral Take 30 mLs by mouth daily as needed. For constipation    . ADULT MULTIVITAMIN W/MINERALS CH Oral Take 1 tablet by mouth daily.    . DOUBLE ANTIBIOTIC 500-10000 UNIT/GM EX OINT Topical Apply 1 application topically daily. Applied to affected area.    Marland Kitchen  POTASSIUM CHLORIDE CRYS ER 20 MEQ PO TBCR Oral Take 20 mEq by mouth daily.     . PSYLLIUM 0.52 G PO CAPS Oral Take 0.52 g by mouth daily.      BP 125/78  Pulse 55  Temp 98 F (36.7 C) (Oral)  Resp 15  SpO2 99%  Physical Exam  Nursing note and vitals reviewed. Constitutional: He is oriented to person, place, and time. He appears well-developed and well-nourished. No distress.  HENT:  Head: Normocephalic and atraumatic.  Eyes: Conjunctivae normal and EOM are normal.  Neck: Normal range of motion.  Cardiovascular: Normal rate, regular rhythm, S1 normal, S2 normal and normal heart sounds.   No murmur heard. Pulses:      Dorsalis pedis pulses are 2+ on the right side, and 2+ on the left side.       Posterior tibial pulses are 2+ on the right side, and 2+ on the left side.  Pulmonary/Chest: Effort normal.       Bilateral basilar crackles  Musculoskeletal: Normal range of motion.  Neurological: He is alert and oriented to person, place, and time.       CN III-XIII intact. Grip strength and biceps 5/5 strength  and equal bilaterally. Hip flexion, plantar flexion, and dorsiflexion 5/5 strength and equal bilaterally   Skin: Skin is warm and dry. No rash noted. He is not diaphoretic.  Psychiatric: He has a normal mood and affect. His behavior is normal.    ED Course  Procedures (including critical care time)  Labs Reviewed  CBC WITH DIFFERENTIAL - Abnormal; Notable for the following:    HCT 38.7 (*)     All other components within normal limits  COMPREHENSIVE METABOLIC PANEL  URINALYSIS, ROUTINE W REFLEX MICROSCOPIC   No results found.   No diagnosis found.   MDM  Alt mental status  Patient w baseline dementia & parkinson's presents with new altered mental status from his nursing home. Symptom onset per his son began last evening and has not yet resolved.  CBC, CMP, UA ordered to assess for infection, anemia, metabolic disturbances.  CXR to assess for infection, acute  abnormalities. Non Contrast Head CT to assess patient's acute mental status change. Disposition will be pending lab studies and reassessment. Will be signed out to the oncoming provider, [place] PA-C          Jaci Carrel, [place] 05/22/12 1526

## 2012-05-23 DIAGNOSIS — I4949 Other premature depolarization: Secondary | ICD-10-CM

## 2012-05-23 DIAGNOSIS — G20 Parkinson's disease: Secondary | ICD-10-CM

## 2012-05-23 DIAGNOSIS — F05 Delirium due to known physiological condition: Secondary | ICD-10-CM

## 2012-05-23 DIAGNOSIS — I498 Other specified cardiac arrhythmias: Secondary | ICD-10-CM

## 2012-05-23 DIAGNOSIS — I359 Nonrheumatic aortic valve disorder, unspecified: Secondary | ICD-10-CM

## 2012-05-23 DIAGNOSIS — I493 Ventricular premature depolarization: Secondary | ICD-10-CM

## 2012-05-23 DIAGNOSIS — F0390 Unspecified dementia without behavioral disturbance: Secondary | ICD-10-CM

## 2012-05-23 LAB — MRSA PCR SCREENING: MRSA by PCR: NEGATIVE

## 2012-05-23 LAB — CBC
MCH: 31.1 pg (ref 26.0–34.0)
MCHC: 34.3 g/dL (ref 30.0–36.0)
RDW: 12.7 % (ref 11.5–15.5)

## 2012-05-23 LAB — BASIC METABOLIC PANEL
BUN: 15 mg/dL (ref 6–23)
Calcium: 9.1 mg/dL (ref 8.4–10.5)
Creatinine, Ser: 0.72 mg/dL (ref 0.50–1.35)
GFR calc Af Amer: >90 mL/min (ref >90)
GFR calc non Af Amer: >90 mL/min (ref >90)
Glucose, Bld: 117 mg/dL — ABNORMAL HIGH (ref 70–99)

## 2012-05-23 LAB — PRO B NATRIURETIC PEPTIDE: Pro B Natriuretic peptide (BNP): 2460 pg/mL — ABNORMAL HIGH (ref 0–125)

## 2012-05-23 MED ORDER — QUETIAPINE FUMARATE 25 MG PO TABS
25.0000 mg | ORAL_TABLET | Freq: Every day | ORAL | Status: DC
  Administered 2012-05-23: 25 mg via ORAL
  Filled 2012-05-23 (×2): qty 1

## 2012-05-23 MED ORDER — ENSURE COMPLETE PO LIQD
237.0000 mL | Freq: Two times a day (BID) | ORAL | Status: DC
  Administered 2012-05-23 – 2012-05-29 (×11): 237 mL via ORAL

## 2012-05-23 MED ORDER — NAPHAZOLINE HCL 0.1 % OP SOLN
1.0000 [drp] | Freq: Four times a day (QID) | OPHTHALMIC | Status: DC | PRN
  Filled 2012-05-23: qty 15

## 2012-05-23 NOTE — Progress Notes (Signed)
Family Medicine Teaching Service                                                                 [facility] Progress Note     Patient name: Joseph [name] Medical record number: [id number] Date of birth: March 24, 1941 Age: 71 y.o. Gender: male    LOS: 1 day   Primary Care Provider: Kimber Relic, MD  Overnight Events:   Objective: Vital signs in last 24 hours: BP 137/94  Pulse 42  Temp 98.4 F (36.9 C) (Oral)  Resp 23  Ht 6\' 4"  (1.93 m)  Wt 233 lb 14.5 oz (106.1 kg)  BMI 28.47 kg/m2  SpO2 98%    Physical Exam: Gen: NAD HEENT:  Mucosa moist. PEERL, EOMI. No cervical adenopathy.  CV: RRR. No murmurs. Res: Clear breath sounds. No rales. No wheezes. Abd: Soft. Non tender. Non distended. Ext/Musc: No edema Neuro:Oriented in. No focal motor or sensory deficits.  Labs/Studies:   Medications: Scheduled Meds:   . carbidopa-levodopa  1 tablet Oral QID  . cholecalciferol  1,000 Units Oral Daily  . docusate sodium  100 mg Oral Daily  . enoxaparin (LOVENOX) injection  40 mg Subcutaneous Q24H  . escitalopram  20 mg Oral Daily  . multivitamin with minerals  1 tablet Oral Daily  . potassium chloride SA  20 mEq Oral Daily  . psyllium  1 packet Oral Daily  . sodium chloride  3 mL Intravenous Q12H  . DISCONTD: psyllium  0.52 g Oral Daily   Continuous Infusions:  PRN Meds:.acetaminophen, acetaminophen  CMP     Component Value Date/Time   NA 139 05/23/2012 0700   K 3.9 05/23/2012 0700   CL 106 05/23/2012 0700   CO2 25 05/23/2012 0700   GLUCOSE 117* 05/23/2012 0700   BUN 15 05/23/2012 0700   CREATININE 0.72 05/23/2012 0700   CALCIUM 9.1 05/23/2012 0700   PROT 6.5 05/22/2012 1400   ALBUMIN 3.5 05/22/2012 1400   AST 15 05/22/2012 1400   ALT 12 05/22/2012 1400   ALKPHOS 61 05/22/2012 1400   BILITOT 0.4 05/22/2012 1400   GFRNONAA >90 05/23/2012 0700   GFRAA >90 05/23/2012 0700   CBC    Component Value Date/Time   WBC 6.6 05/23/2012 0700   RBC 4.05* 05/23/2012 0700   HGB 12.6*  05/23/2012 0700   HCT 36.7* 05/23/2012 0700   PLT 179 05/23/2012 0700   MCV 90.6 05/23/2012 0700   MCH 31.1 05/23/2012 0700   MCHC 34.3 05/23/2012 0700   RDW 12.7 05/23/2012 0700   LYMPHSABS 1.2 05/22/2012 1400   MONOABS 0.7 05/22/2012 1400   EOSABS 0.3 05/22/2012 1400   BASOSABS 0.0 05/22/2012 1400    BNP    Component Value Date/Time   PROBNP 2460.0* 05/23/2012 0700       Assessment/Plan  Joseph [name] is 71 y.o caucasian male with a history of Parkinson disease, dementia, seizures, depression, hypertension and recent UTI. He presented to the ED today after he had some hallucinations and episodes of paranoia with agitation.   1. Bradycardia:  - Admit for Inpatient on Telemetry  - BNP: 2460 - troponin - negative - urine culture pending  - EKG Pending  - Orthostatic BP's  - Cards consulted today - CT: Stable brain  atrophy without acute process by noncontrast CT.   2. Parkinson disease/dementia:  - Continue Home meds  - started Seroquel today - Neuro consulted  3. FENGI:  - heart healthy diet  - Tylenol PRN  - Zofran PRN  - Colace PRN  - Metamucil  PPX: Lovenox  Code: Full Code. Some question on NG tube placement and other details. Son will bring in living will.  DISPO: Pending cardiac stabilization and return to baseline mental status.

## 2012-05-23 NOTE — ED Provider Notes (Signed)
Medical screening examination/treatment/procedure(s) were conducted as a shared visit with non-physician practitioner(s) and myself.  I personally evaluated the patient during the encounter 71 year old man with dementia and parkinsonism, felt to have had worsening of his mental status in the past 24 hours. Exam shows him to be an elderly man who is awake but does not converse. Heart and lungs clear. Abdomen soft and nontender. EKG shows frequent PVC's. CBC, chemistries, UA, chest x-ray and CT of head unchanged. Review of cardiac monitoring shows episodes of bradycardia into the 30's. He has not had cardiology evaluation for these episodes of bradycardia. Recommend admission for observation on monitor, ? Cardiology consultation.       Carleene Cooper III, MD 05/23/12 [phone]

## 2012-05-23 NOTE — Consult Note (Signed)
CARDIOLOGY CONSULT NOTE  Patient ID: Joseph [name] Age, MRN: [id number], DOB/AGE: [birth year] 72 y.o. Admit date: 05/22/2012   Date of Consult: 05/23/2012 Primary Physician: Kimber Relic, MD Primary Cardiologist: None  Chief Complaint: mental decline Reason for Consult: reported bradycardia, ventricular bigeminy  HPI: Joseph [name] is a 71 y/o M with hx of Parkinson disease, dementia, seizures, depression, hypertension and recent UTI but no known cardiac history. He presented to the ER yesterday after he had some hallucinations and episodes of paranoia at [facility]. The staff reported he was seeing people that were not in the room and people that have passed away. He also was seeing the wall break away. His son reported his father had been declining mentally and physically since his his (patient's) wife passed away a little over a year ago. At that time he was living on his own and fell and broke his hip. After the hip surgery he had a rapid decline in health and mental function. He was diagnosed with parkinson's at that time and placed on a medication for it.   Per admission history, he saw urology on 8/27 for f/u UTI and was noted to have HR in the 30's there -- note EKG on the chart from 8/28 shows NSR with frequent PVCs. He was seen last week 05/14/12 in the ER with AMS with stable VS, relatively unremarkable labs, EKG with freq PVCs, and was discharged home. He returned yesterday as staff at his facility noted he was confused and difficult to arouse. Per Dr. Jacques Navy ER note from yesterday, cardiac monitoring in the ER showed episodes of bradycardia into the 30's. I cannot find any strips of these events, but he does have frequent PVCs with intermittent bigeminy on some of his tracings and telemetry with HR's remaining in the 60s. Workup this admission so far: CXR- cardiomegaly with stable mild edema, CT head stable brain atrophy without acute process. Labs show pBNP 2460, but otherwise  unremarkable thus far with neg troponin x 2, Mg 2.2. He is afebrile and normotensive. Pertinent home meds include carbidopa-levidopa, Celexa, potassium. Not on any BB therapy. He continued to yell out angrily here in the unit earlier this afternoon ("LET ME OUT OF HERE!" "AHHHH COME ON!!!" "GET OUT. I'M GONNA P*SS IN THE MUD IF YOU DON'T"). He is not a reliable historian. 2D echo is pending.  Past Medical History  Diagnosis Date  . Parkinson disease   . Seizures   . Depression   . Hypertension   . Dementia   . UTI (lower urinary tract infection)       Most Recent Cardiac Studies: 2D echo pending   Surgical History:  Past Surgical History  Procedure Date  . Hip fracture surgery      Home Meds: Prior to Admission medications   Medication Sig Start Date End Date Taking? Authorizing Provider  carbidopa-levodopa (SINEMET) 25-100 MG per tablet Take 1 tablet by mouth 4 (four) times daily.    Yes Historical Provider, MD  cholecalciferol (VITAMIN D) 1000 UNITS tablet Take 1,000 Units by mouth daily.   Yes Historical Provider, MD  docusate sodium (COLACE) 100 MG capsule Take 100 mg by mouth daily.     Yes Historical Provider, MD  escitalopram (LEXAPRO) 20 MG tablet Take 20 mg by mouth daily.     Yes Historical Provider, MD  ferrous sulfate 325 (65 FE) MG tablet Take 325 mg by mouth 2 (two) times daily.     Yes Historical  Provider, MD  magnesium hydroxide (MILK OF MAGNESIA) 400 MG/5ML suspension Take 30 mLs by mouth daily as needed. For constipation   Yes Historical Provider, MD  Multiple Vitamin (MULTIVITAMIN WITH MINERALS) TABS Take 1 tablet by mouth daily.   Yes Historical Provider, MD  polymixin-bacitracin (POLYSPORIN) 500-10000 UNIT/GM OINT ointment Apply 1 application topically daily. Applied to affected area.   Yes Historical Provider, MD  potassium chloride SA (K-DUR,KLOR-CON) 20 MEQ tablet Take 20 mEq by mouth daily.    Yes Historical Provider, MD  psyllium (REGULOID) 0.52 G capsule  Take 0.52 g by mouth daily.   Yes Historical Provider, MD    Inpatient Medications:    . carbidopa-levodopa  1 tablet Oral QID  . cholecalciferol  1,000 Units Oral Daily  . docusate sodium  100 mg Oral Daily  . enoxaparin (LOVENOX) injection  40 mg Subcutaneous Q24H  . escitalopram  20 mg Oral Daily  . feeding supplement  237 mL Oral BID BM  . multivitamin with minerals  1 tablet Oral Daily  . potassium chloride SA  20 mEq Oral Daily  . psyllium  1 packet Oral Daily  . sodium chloride  3 mL Intravenous Q12H  . DISCONTD: psyllium  0.52 g Oral Daily    Allergies: No Known Allergies  History   Social History  . Marital Status: Widowed    Spouse Name: N/A    Number of Children: N/A  . Years of Education: N/A   Occupational History  . Not on file.   Social History Main Topics  . Smoking status: Never Smoker   . Smokeless tobacco: Not on file  . Alcohol Use: No  . Drug Use: No  . Sexually Active:    Other Topics Concern  . Not on file   Social History Narrative  . No narrative on file    Family History: Pt not reliably oriented to obtain  Review of Systems:Pt not reliably oriented to obtain fully. What could be obtained is noted above.  Labs:  [facility] 05/23/12 0700 05/22/12 2153  CKTOTAL -- --  CKMB -- --  TROPONINI <0.30 <0.30   Lab Results  Component Value Date   WBC 6.6 05/23/2012   HGB 12.6* 05/23/2012   HCT 36.7* 05/23/2012   MCV 90.6 05/23/2012   PLT 179 05/23/2012    Lab 05/23/12 0700 05/22/12 1400  NA 139 --  K 3.9 --  CL 106 --  CO2 25 --  BUN 15 --  CREATININE 0.72 --  CALCIUM 9.1 --  PROT -- 6.5  BILITOT -- 0.4  ALKPHOS -- 61  ALT -- 12  AST -- 15  GLUCOSE 117* --    Radiology/Studies:  Ct Head Wo Contrast 05/22/2012  *RADIOLOGY REPORT*  Clinical Data: Confusion, decreased level of consciousness, difficult to arouse  CT HEAD WITHOUT CONTRAST  Technique:  Contiguous axial images were obtained from the base of the skull through the vertex  without contrast.  Comparison: 01/04/2012  Findings: Diffuse brain atrophy evident involving the cerebrum and the cerebellum.  No acute intracranial hemorrhage, mass lesion, focal edema, definite acute infarction, midline shift, herniation, hydrocephalus, or extra-axial fluid collection.  Cisterns patent. Mastoid sinuses clear.  Exam is limited with some motion artifact.  IMPRESSION: Stable brain atrophy without acute process by noncontrast CT.   Original Report Authenticated By: Judie Petit. Ruel Favors, M.D.    Dg Chest Portable 1 View 05/22/2012  *RADIOLOGY REPORT*  Clinical Data: Altered mental status with history of Parkinson's disease.  Dementia.  PORTABLE CHEST - 1 VIEW  Comparison: 05/14/2012  Findings: Cardiomegaly.  Mild edema is stable.  No infiltrates or overt failure.  Osteopenia.  No effusion or pneumothorax.  IMPRESSION: Cardiomegaly with mild edema is stable.   Original Report Authenticated By: Elsie Stain, M.D.    Dg Chest Port 1 View 05/14/2012  *RADIOLOGY REPORT*  Clinical Data: Weakness, shortness of breath  PORTABLE CHEST - 1 VIEW  Comparison: None  Findings: There is cardiomegaly present and there may be very minimal pulmonary vascular congestion present.  Small effusions cannot be excluded.  No acute bony abnormality is seen.  There is a thoracic curvature convex to the right.  IMPRESSION: Mild cardiomegaly.  Question mild interstitial edema.   Original Report Authenticated By: Juline Patch, M.D.    EKG: 05/22/12: NSR 68bpm with frequent PVCs, QTc 452  Physical Exam: Blood pressure 120/46, pulse 42, temperature 98.2 F (36.8 C), temperature source Oral, resp. rate 23, height 6\' 4"  (1.93 m), weight 233 lb 14.5 oz (106.1 kg), SpO2 98.00%. General: Well developed WM in no acute distress. Head: Normocephalic, atraumatic, sclera non-icteric, no xanthomas, nares are without discharge.  Neck:  JVD not elevated. Lungs: Clear bilaterally to auscultation without wheezes, rales, or rhonchi.  Breathing is unlabored. Heart: RRR with S1 S2 with soft aortic murmur. No rubs or gallops appreciated. Abdomen: Soft, non-tender, non-distended with normoactive bowel sounds. No hepatomegaly. No rebound/guarding. No obvious abdominal masses. Msk:  Tone appears normal for age. Extremities: No clubbing or cyanosis. Tr bilateral edema.  Distal pedal pulses are 2+ and equal bilaterally. Neuro: Not oriented to situation. He continues to yell out to no one in particular, using expletives at random. No facial asymmetry. Baseline tremor noted.   Assessment and Plan:   1. Worsening paranoia/AMS/hallucinations with hx of dementia, Parkinson's disease - unclear etiology but doubt acute cardiac issue contributing to this presentation. Agree with consulting neuro per earlier note. 2. Ventricular bigeminy - uncertain if pt had true bradycardia or pseudobradycardia as a result of frequent PVCs. HR in 50s-70s on tele with intermittent bigeminy. With his underlying dementia and AMS at present would likely not be pacemaker candidate if he did demonstrate significant bradycardia. Will f/u echo result. 3. H/o HTN - controlled.  Signed, Ronie Spies PA-C 05/23/2012, 2:16 PM   Patient seen and examined with Ronie Spies, PA-C. We discussed all aspects of the encounter. I agree with the assessment and plan as stated above. ECG and telemetry reviewed. Mr. Hawes does have frequent PVCs which may make his overall HR seem lower than it actually is however I do not see anything from a cardiology perspective that would require pacing or contribute to his altered mental status. We will sign off. Please do not hesitate to call us back if his situation changes.   Harla Mensch,MD 3:22 PM

## 2012-05-23 NOTE — ED Provider Notes (Signed)
71 year old man with dementia and parkinsonism, felt to have had worsening of his mental status in the past 24 hours. Exam shows him to be an elderly man who is awake but does not converse. Heart and lungs clear. Abdomen soft and nontender. EKG shows frequent PVC's. CBC, chemistries, UA, chest x-ray and CT of head unchanged. Review of cardiac monitoring shows episodes of bradycardia into the 30's. He has not had cardiology evaluation for these episodes of bradycardia. Recommend admission for observation on monitor, ? Cardiology consultation.          Carleene Cooper III, MD 05/23/12 1320

## 2012-05-23 NOTE — Progress Notes (Signed)
Utilization review completed.  

## 2012-05-23 NOTE — Progress Notes (Signed)
Pt refusing Blood draws, Lovenox injection and EKG, MD aware. Pt states, "you can get it in first thing in the morning"

## 2012-05-23 NOTE — H&P (Addendum)
I interviewed and examined this patient and discussed the care plan with Dr. Gwenlyn Saran and the [facility] team and agree with assessment and plan as documented in the admission note for yesterday. He is currently in a bigeminal rhythm. He is alert and pleasantly responsive. He registered 3 words and recalled one. Oriented to [facility], but doesn't recall the hospital name. Knows that it is September. Says he moved from [place] a few months ago. Originally from [place], but can't state where he was living before moving here, nor the name of the place where he currently lives. He has masked facies and fine rest tremor. His knees lack 20 degrees of extension due to apparent contracture indicating that he hasn't been walking recently.   Dr Gwenlyn Saran ordered a BNP due to the pulmonary edema seen on his CXR  [facility] A. Sheffield Slider, MD Family Medicine Teaching Service Attending  05/23/2012 8:28 AM

## 2012-05-23 NOTE — Progress Notes (Signed)
Discussed in rounds and seen earlier today by attending, Dr. Sheffield Slider.  Agree with Dr. Alan Ripper management and documentation.

## 2012-05-23 NOTE — Progress Notes (Signed)
Healthcare POA and living will copies placed in shadow (red) chart. Oringal copies given back to family.

## 2012-05-23 NOTE — Progress Notes (Signed)
INITIAL ADULT NUTRITION ASSESSMENT Date: 05/23/2012   Time: 11:25 AM  INTERVENTION:  Ensure Complete twice daily between meals (350 kcals, 13 gm protein per 8 fl oz bottle) RD to follow for nutrition care plan  Reason for Assessment: Malnutrition Screening Tool Report  ASSESSMENT: Male 71 y.o.  Dx: altered mental status  Hx:  Past Medical History  Diagnosis Date  . Parkinson disease   . Seizures   . Depression   . Hypertension   . Dementia   . UTI (lower urinary tract infection)     Related Meds:     . carbidopa-levodopa  1 tablet Oral QID  . cholecalciferol  1,000 Units Oral Daily  . docusate sodium  100 mg Oral Daily  . enoxaparin (LOVENOX) injection  40 mg Subcutaneous Q24H  . escitalopram  20 mg Oral Daily  . multivitamin with minerals  1 tablet Oral Daily  . potassium chloride SA  20 mEq Oral Daily  . psyllium  1 packet Oral Daily  . sodium chloride  3 mL Intravenous Q12H  . DISCONTD: psyllium  0.52 g Oral Daily    Ht: 6\' 4"  (193 cm)  Wt: 233 lb 14.5 oz (106.1 kg)  Ideal Wt: 91.8 kg % Ideal Wt: 115%  Usual Wt: unable to obtain % Usual Wt: ---  Body mass index is 28.47 kg/(m^2).  Food/Nutrition Related Hx: recent weight lost without trying (patient unsure) per admission nutrition screen  Labs:  CMP     Component Value Date/Time   NA 139 05/23/2012 0700   K 3.9 05/23/2012 0700   CL 106 05/23/2012 0700   CO2 25 05/23/2012 0700   GLUCOSE 117* 05/23/2012 0700   BUN 15 05/23/2012 0700   CREATININE 0.72 05/23/2012 0700   CALCIUM 9.1 05/23/2012 0700   PROT 6.5 05/22/2012 1400   ALBUMIN 3.5 05/22/2012 1400   AST 15 05/22/2012 1400   ALT 12 05/22/2012 1400   ALKPHOS 61 05/22/2012 1400   BILITOT 0.4 05/22/2012 1400   GFRNONAA >90 05/23/2012 0700   GFRAA >90 05/23/2012 0700    CMP     Component Value Date/Time   NA 139 05/23/2012 0700   K 3.9 05/23/2012 0700   CL 106 05/23/2012 0700   CO2 25 05/23/2012 0700   GLUCOSE 117* 05/23/2012 0700   BUN 15 05/23/2012  0700   CREATININE 0.72 05/23/2012 0700   CALCIUM 9.1 05/23/2012 0700   PROT 6.5 05/22/2012 1400   ALBUMIN 3.5 05/22/2012 1400   AST 15 05/22/2012 1400   ALT 12 05/22/2012 1400   ALKPHOS 61 05/22/2012 1400   BILITOT 0.4 05/22/2012 1400   GFRNONAA >90 05/23/2012 0700   GFRAA >90 05/23/2012 0700     Intake/Output Summary (Last 24 hours) at 05/23/12 1129 Last data filed at 05/23/12 0750  Gross per 24 hour  Intake      0 ml  Output      0 ml  Net      0 ml    Diet Order: Cardiac  Supplements/Tube Feeding: N/A  IVF: N/A  Estimated Nutritional Needs:   Kcal: 2000-2200 Protein: 100-110 gm Fluid: 2.0-2.2 L  Patient presented to the ED after he had some hallucinations and episodes of paranoia at his assisted living community ([facility]); per H&P review, son reports patient's physical and mental status have been declining; RD unable to obtain nutrition hx upon visitation; attempted to call patient's son -- no answer; no % PO intake records available per flowsheet  records; no previous weight records available at this time to assess weight loss -- would benefit from supplement addition -- RD to order.  NUTRITION DIAGNOSIS: -Inadequate oral intake (NI-2.1).  Status: Ongoing  RELATED TO: altered mental status  AS EVIDENCE BY: no % meal intake recorded  MONITORING/EVALUATION(Goals): Goal: Oral intake with meals & supplements to meet >/= 90% of estimated nutrition needs Monitor: PO & supplemental intake, weight, labs, I/O's  EDUCATION NEEDS: -No education needs identified at this time  Dietitian #: [phone]  DOCUMENTATION CODES Per approved criteria  -Not Applicable    Alger Memos 05/23/2012, 11:25 AM

## 2012-05-23 NOTE — Progress Notes (Signed)
Patient's son, Makyi Ledo, came by and tells me that his father has seen Dr Marjory Lies, at [facility]. I would consult neurology to consider Quetiapine for his delusions. We will avoid using this type of medication until cardiology has evaluated his rhythm problems.

## 2012-05-23 NOTE — Progress Notes (Signed)
  Echocardiogram 2D Echocardiogram has been performed.  Joseph [name] 05/23/2012, 1:53 PM

## 2012-05-24 MED ORDER — QUETIAPINE FUMARATE 25 MG PO TABS
25.0000 mg | ORAL_TABLET | Freq: Two times a day (BID) | ORAL | Status: DC
  Administered 2012-05-24: 25 mg via ORAL
  Filled 2012-05-24 (×4): qty 1

## 2012-05-24 NOTE — Progress Notes (Signed)
Daily Progress Note Family Medicine Teaching Service PGY-1   Patient name: Joseph [name]  Medical record [id number] Date of birth:1940-10-04 Age: 71 y.o. Gender: male  LOS: 2 days   Subjective: feeling better. Having breakfast at the time I walked in the room. Oriented x2 (person and place, ok year and month) answered appropriately my questions. No agitation. We don't know if this is pt's baseline mentation, but much improved since admission.   Objective: Vitals: Filed Vitals:   05/24/12 0700  BP: 138/57  Pulse: 31  Temp: 98.1 F (36.7 C)  Resp: 20    Intake/Output Summary (Last 24 hours) at 05/24/12 0929 Last data filed at 05/24/12 0900  Gross per 24 hour  Intake    963 ml  Output   1750 ml  Net   -787 ml   Physical Exam: Gen:  NAD HEENT: Moist mucous membranes CV: Regular rate and rhythm, no murmurs rubs or gallops PULM: Clear to auscultation bilaterally. No wheezes/rales/rhonchi ABD: Soft, non tender, non distended, normal bowel sounds EXT: No edema. Small wound on left knee with proper dressing applied.  Neuro: Alert and oriented x2. Did not appreciate tremor.   Labs and imaging:  CBC  Lab 05/23/12 0700 05/22/12 1400  WBC 6.6 6.4  HGB 12.6* 13.2  HCT 36.7* 38.7*  PLT 179 194   BMET  Lab 05/23/12 0700 05/22/12 1400  NA 139 138  K 3.9 4.4  CL 106 104  CO2 25 26  BUN 15 17  CREATININE 0.72 0.81  LABGLOM -- --  GLUCOSE 117* --  CALCIUM 9.1 9.4    Ct Head Wo Contrast 05/22/2012 IMPRESSION: Stable brain atrophy without acute process by noncontrast CT.   Original Report Authenticated By: Judie Petit. Ruel Favors, M.D.    Dg Chest Portable 1 View 05/22/2012 IMPRESSION: Cardiomegaly with mild edema is stable.   Original Report Authenticated By: Elsie Stain, M.D.    Medications: Scheduled Meds:   . carbidopa-levodopa  1 tablet Oral QID  . cholecalciferol  1,000 Units Oral Daily  . docusate sodium  100 mg Oral Daily  . enoxaparin (LOVENOX) injection  40 mg  Subcutaneous Q24H  . escitalopram  20 mg Oral Daily  . feeding supplement  237 mL Oral BID BM  . multivitamin with minerals  1 tablet Oral Daily  . potassium chloride SA  20 mEq Oral Daily  . psyllium  1 packet Oral Daily  . QUEtiapine  25 mg Oral QHS  . sodium chloride  3 mL Intravenous Q12H   Continuous Infusions:  PRN Meds:.acetaminophen, acetaminophen, naphazoline  Assessment and Plan: 71 y/o pt with hx of Parkinson's disease, dementia, seizures, depression, HTN and recent UTI admitted yesterday for episodes of hallucinations, paranoia with agitation and found to have bradycardia.   1. Delirium vs sundowning of dementia: pt more stable now. Normal WBC, no fevers, Urine Culture pending results. CXR stable no acute findings.  - started yesterday on quetiapine. Pt has not had any more episodes of agitation. Unsure if this is pt's baseline. Pt's neurologist is Dr Marjory Lies. Will be valuable to consult him for further recommendations. Not sure if this will happen over the weekend. Should Aricept be consider in this pt (?)  2. Bradycardia (?) EKG with ventricular bigeminy. No pacemaker candidate for comorbidities per Cardiology recommendations. ECHO showed grade 2 diastolic dysfunction, mild aortic regurgitation, moderated LVH and moderated dilated left atrium. No signs of fluid overload. HTN controlled  -Appreciate cardiology consult and recommendations  FEN/GI: heart  healthy diet/ saline lock Prophylaxis: lovenox Disposition: pt wheel chair bound. On an ALF. Will consult PT for recommendations and SW to help with placement. For now can be transfer to telemetry floor.  D. [place] Rolene Arbour, MD PGY2, Family Medicine Teaching Service  05/24/2012

## 2012-05-24 NOTE — Progress Notes (Signed)
Pt still confused and sometimes with agitation. He was about to be transferred to telemetry floor but pt started to wanted to leave AMA. Pt MMSE was 22/30 we will need psychiatry to evaluate further for capacity. Meanwhile we spoke face to face with son (power of attorney) who is agreeable that pt need to stay in the hospital until more clear diagnosis and compensation of mental status is achieved. Pt neurologist consult is still pending. Physical therapy came to consult  and due to pt agitation they differred consult for tomorrow. Pt's son Raylin Winer  is very reasonable and voiced understanding about his father situation and acting on his best interest agrees to the following if needed. Sitter in the room, Physical restraint and pharmacologic agents for sedation if pt becomes a danger to self or other due to agitation.  Case dicussed with attending and pt should continue in step down unit in the event we need to use sedatives for better monitoring.

## 2012-05-24 NOTE — Progress Notes (Signed)
Pt is agitated and insisting that his is to be discharged. Pt son, Laird Runnion, was called and spoke directly to him explaining situation. Son stated he would come speak with pt and MD. MD notified sated she will come speak with both son and pt.

## 2012-05-24 NOTE — Progress Notes (Signed)
Seen and examined.  Mr Joseph [name] is unhappy and at times belligerant.  Tamera Punt, he has altered mental status and lacks capacity.  What is less clear is whether this is a sort term problem (delierium) or mostly a long term problem(dementia.)  Despite his request for DC, he is unsafe at this time to return to assisted living.

## 2012-05-24 NOTE — Progress Notes (Signed)
Pt took off monitor, MD/N stated ok to leave monitor off

## 2012-05-24 NOTE — Progress Notes (Signed)
PT Cancellation Note  Treatment cancelled today due to RN request to check back later. RN reports pt confused/aggitated. Will f/u tomorrow. Humberto Seals HELEN 05/24/2012, 1:33 PM Pager: [phone]

## 2012-05-24 NOTE — Consult Note (Signed)
Reason for Consult:AMS Referring Physician: unknown  Joseph [name] is an 71 y.o. male.  HPI: Joseph [name] is 71 y.o caucasian male with a history of Parkinson disease, dementia, seizures, depression, hypertension and recent UTI. He was admitted through ED after he had some hallucinations and episodes of paranoia at his Senior living environment ([facility]). The staff had called his Son to inform him of his fathers sudden decline in mental status. The staff reported he was seeing people that were not in the room and people that have passed away. He also was seeing the wall break away. His son reports his father has been declining mentally and physically since his his (patient's) wife passed away a little over a year ago. At that time he was living on his own and [name] and broke his hip. After the hip surgery he had a rapid decline in health and mental function. He was diagnosed with parkinson's at that time and placed on a medication for it. He had a period in January where he was doing mentally better and was backed off some of his medications. Since then he again has declined.   He was seen in the ED last month and was diagnosed with a UTI. He has two urinalysis since then and they were negative. He was seen for a follow up to this with the urologist on the 27th of August and he was noted to have a heart rate in the 30's there. He was seen last week in [facility] for altered mental status and sent home.    Seen today and his chart was reviewed. appeared to be confused. He is not sure where he is now and also why he is here. Not sure if he has medical problems including the need for being in hospital. Not sure if he needs help for walking or for his other limitations. Not sure why he lives in nursing home. Got angry when he was asked questions and reported that he is here to just pass time. Pt denies any depression or psychotic symptoms now.   Past Medical History  Diagnosis Date  . Parkinson disease     . Seizures   . Depression   . Hypertension   . Dementia   . UTI (lower urinary tract infection)     Past Surgical History  Procedure Date  . Hip fracture surgery     No family history on file.  Social History:  reports that he has never smoked. He does not have any smokeless tobacco history on file. He reports that he does not drink alcohol or use illicit drugs.  Allergies: No Known Allergies  Medications: I have reviewed the patient's current medications.  Results for orders placed during the hospital encounter of 05/22/12 (from the past 48 hour(s))  TROPONIN I     Status: Normal   Collection Time   05/22/12  9:53 PM      Component Value Range Comment   Troponin I <0.30  <0.30 ng/mL   MAGNESIUM     Status: Normal   Collection Time   05/22/12  9:54 PM      Component Value Range Comment   Magnesium 2.2  1.5 - 2.5 mg/dL   TROPONIN I     Status: Normal   Collection Time   05/23/12  7:00 AM      Component Value Range Comment   Troponin I <0.30  <0.30 ng/mL   BASIC METABOLIC PANEL     Status: Abnormal  Collection Time   05/23/12  7:00 AM      Component Value Range Comment   Sodium 139  135 - 145 mEq/L    Potassium 3.9  3.5 - 5.1 mEq/L    Chloride 106  96 - 112 mEq/L    CO2 25  19 - 32 mEq/L    Glucose, Bld 117 (*) 70 - 99 mg/dL    BUN 15  6 - 23 mg/dL    Creatinine, Ser 9.60  0.50 - 1.35 mg/dL    Calcium 9.1  8.4 - 45.4 mg/dL    GFR calc non Af Amer >90  >90 mL/min    GFR calc Af Amer >90  >90 mL/min   CBC     Status: Abnormal   Collection Time   05/23/12  7:00 AM      Component Value Range Comment   WBC 6.6  4.0 - 10.5 K/uL    RBC 4.05 (*) 4.22 - 5.81 MIL/uL    Hemoglobin 12.6 (*) 13.0 - 17.0 g/dL    HCT 09.8 (*) 11.9 - 52.0 %    MCV 90.6  78.0 - 100.0 fL    MCH 31.1  26.0 - 34.0 pg    MCHC 34.3  30.0 - 36.0 g/dL    RDW 14.7  82.9 - 56.2 %    Platelets 179  150 - 400 K/uL   PRO B NATRIURETIC PEPTIDE     Status: Abnormal   Collection Time   05/23/12  7:00 AM       Component Value Range Comment   Pro B Natriuretic peptide (BNP) 2460.0 (*) 0 - 125 pg/mL     No results found.  Review of Systems  Cardiovascular: Negative.    Blood pressure 137/47, pulse 60, temperature 98.7 F (37.1 C), temperature source Oral, resp. rate 20, height 6\' 4"  (1.93 m), weight 106.1 kg (233 lb 14.5 oz), SpO2 94.00%. Physical Exam  Confused on bed lying  Assessment/Plan:      Mental Status Examination/Evaluation:   Appearance: on bed confused   Eye Contact::  poor   Speech: limited   Volume: low   Mood: no answer   Affect: ristricted   Thought Process: disorganized   Orientation: 1/4 only to person. Thinks it is 2032   Thought Content: denies AVH   Suicidal Thoughts: No   Homicidal Thoughts: no   Memory: Recent; Poor   Judgement: Impaired   Insight: Lacking   Psychomotor Activity: slow   Concentration: poor   Recall: poor   Akathisia: No   Assessment:   AXIS I: Delirium  NOS, cognitive d/o nos, r/o dementia AXIS II: Deferred   AXIS III: see mdical hx       AXIS IV: unknown   AXIS V: 15       Treatment Plan/Recommendations:   - Likley delirium on the top on the top of significant cognitive deficits.    - will recommend out pt psy f/u after discharge including dementia work up as out pt   - His hallucinations at nursing home can be secondary to Dementia or Parkinson meds.  Will recommend to send this info to his neurologist and out pt psychiatrist.   - Seroquel is a good choice for him at this time based on hx of Parkinson. It needs to be increased gradually later (tomorrow to Seroquel XR 50 mg QHS). risperidone 0.25- 0.5 mg QHS can used for agiatation if needed (as PRN). I think Aricept etc should be  considered as out pt.   - will continue to follow tomorrow  Wonda Cerise 05/24/2012, 9:26 PM

## 2012-05-24 NOTE — Progress Notes (Signed)
Pt is refusing to take po medication at this time and pt removed IV. MD was notified of situation, stated that it was okay to not have IV access at this time.

## 2012-05-25 DIAGNOSIS — F22 Delusional disorders: Secondary | ICD-10-CM

## 2012-05-25 LAB — URINE CULTURE: Colony Count: 30000

## 2012-05-25 MED ORDER — DONEPEZIL HCL 5 MG PO TABS
5.0000 mg | ORAL_TABLET | Freq: Every day | ORAL | Status: DC
  Administered 2012-05-26 – 2012-05-28 (×3): 5 mg via ORAL
  Filled 2012-05-25 (×5): qty 1

## 2012-05-25 MED ORDER — NAPHAZOLINE-PHENIRAMINE 0.025-0.3 % OP SOLN
1.0000 [drp] | Freq: Four times a day (QID) | OPHTHALMIC | Status: DC | PRN
  Filled 2012-05-25: qty 15

## 2012-05-25 MED ORDER — QUETIAPINE FUMARATE ER 50 MG PO TB24
50.0000 mg | ORAL_TABLET | Freq: Every day | ORAL | Status: DC
  Administered 2012-05-26 – 2012-05-29 (×4): 50 mg via ORAL
  Filled 2012-05-25 (×4): qty 1

## 2012-05-25 NOTE — Evaluation (Signed)
Physical Therapy Evaluation Patient Details Name: Joseph [name] MRN: [id number] DOB: 07-Apr-1941 Today's Date: 05/25/2012 Time:  -     PT Assessment / Plan / Recommendation Clinical Impression  Joseph [name] is 71 y.o caucasian male with a history of Parkinson disease, dementia, seizures, depression, hypertension and recent UTI. He presented to the ED today after he had some  hallucinations and episodes of paranoia at his Senior living environment ([facility]).  Presents to PT today with limited mobility secondary to generalized weakness, decreased activity tolerance and muscle contractures in addition to cognitive deficits. Will benefit physical therapy in the acute setting to address these and the below deficits so as to maximize function and decrease burden of care at next venue. Rec SNF for f/u at this time.     PT Assessment  Patient needs continued PT services    Follow Up Recommendations  Skilled nursing facility    Barriers to Discharge        Equipment Recommendations  Defer to next venue    Recommendations for Other Services OT consult   Frequency Min 3X/week    Precautions / Restrictions Precautions Precautions: Fall         Mobility  Bed Mobility Bed Mobility: Rolling Left;Rolling Right;Scooting to [facility];Sit to Supine Rolling Right: 3: Mod assist Rolling Left: 3: Mod assist Sit to Supine: 1: +2 Total assist Sit to Supine: Patient Percentage: 50% Scooting to HOB: 1: +2 Total assist Scooting to [facility]: Patient Percentage: 40% Details for Bed Mobility Assistance: mod facilitation and sequencing cues for rolling; facilitation to bring legs off bed (pt keeping hips/knees/trunk rigidly flexed) for supine->sit with mod facilitation to bring trunk upright EOB; sit->supine pt needing total assist to bring legs to bed level (again pt maintaining trunk in rigid flexion) and mod facilitation to lower upper trunk to supine Transfers Transfers: Sit to Stand;Stand to Sit;Stand Pivot  Transfers Sit to Stand: 1: +2 Total assist;From elevated surface;From bed;With upper extremity assist (pt pulling on the stedy to come up from bed) Sit to Stand: Patient Percentage: 30% Stand to Sit: 1: +2 Total assist;To elevated surface;To bed;With upper extremity assist Stand to Sit: Patient Percentage: 40% Stand Pivot Transfers: 1: +2 Total assist Stand Pivot Transfers: Patient Percentage: 40% Transfer via Lift Equipment: Stedy Details for Transfer Assistance: attempted sit->stand x2 but pt with difficulty clearing hips secondary to knee contractures and difficulty bringing trunk over his feet, pt also with poor foot placement secondary to leg contractures needing heavy facilitation to separate them in sitting (left leg strongly externally rotated keeping foot too far to the right to be able to support himself on that foot); attempt Stedy Lift equipment but again pt unable to achieve enough upright trunk/knee/hip extension for tall posture so as to lower the flaps so patient could sit (this was attempted twice); pt using upper extremities well during this activity however;  attempted SPT with +2totalpt40-50% effort however demonstrating impulsivity with poor awareness of safety of technique so attempts aborted and lift equipment attempted Ambulation/Gait Ambulation/Gait Assistance: Not tested (comment) (pt does not walk at baseline)    Exercises     PT Diagnosis: Generalized weakness;Altered mental status  PT Problem List: Decreased strength;Decreased range of motion;Decreased activity tolerance;Decreased balance;Decreased mobility;Decreased coordination;Impaired tone;Decreased cognition;Decreased knowledge of use of DME;Decreased safety awareness PT Treatment Interventions: Functional mobility training;Therapeutic activities;Therapeutic exercise;Neuromuscular re-education;Patient/family education;Cognitive remediation;Wheelchair mobility training   PT Goals Acute Rehab PT Goals PT Goal  Formulation: With patient Time For Goal Achievement: 06/08/12 Potential to Achieve  Goals: Fair Pt will Roll Supine to Right Side: with min assist PT Goal: Rolling Supine to Right Side - Progress: Goal set today Pt will Roll Supine to Left Side: with min assist PT Goal: Rolling Supine to Left Side - Progress: Goal set today Pt will Transfer Bed to Chair/Chair to Bed: with mod assist (scooting transfer??) PT Transfer Goal: Bed to Chair/Chair to Bed - Progress: Goal set today Pt will Propel Wheelchair: 51 - 150 feet;with supervision PT Goal: Propel Wheelchair - Progress: Goal set today  Visit Information       Subjective Data  Subjective: You want me to get up to the chair? Ok, I'll try.    Prior Functioning  Home Living Lives With: Alone Available Help at Discharge: Family;Available PRN/intermittently;Other (Comment) (Assisted living staff 24 hours/day) Type of Home: Assisted living Home Adaptive Equipment: Wheelchair - manual Additional Comments: Pt a poor historian so unsure of the accuracy of his answers Prior Function Level of Independence: Needs assistance Needs Assistance: Transfers;Gait Gait Assistance: w/c bound Transfer Assistance: Pt reports he has a friend help him get into his w/c but unable to describe his transfer method Able to Take Stairs?: No Driving: No Communication Communication: HOH    Cognition  Overall Cognitive Status: Impaired Area of Impairment: Attention;Safety/judgement;Awareness of errors;Awareness of deficits;Problem solving;Following commands Arousal/Alertness: Awake/alert Orientation Level: Appears intact for tasks assessed Behavior During Session: [facility] for tasks performed (fairly pleasant and agreeable) Current Attention Level: Sustained Following Commands: Follows one step commands inconsistently;Follows multi-step commands inconsistently;Follows multi-step commands with increased time Safety/Judgement: Decreased awareness of safety  precautions;Decreased safety judgement for tasks assessed;Decreased awareness of need for assistance;Impulsive Awareness of Errors: Assistance required to identify errors made;Assistance required to correct errors made    Extremity/Trunk Assessment Right Upper Extremity Assessment RUE ROM/Strength/Tone: Deficits RUE ROM/Strength/Tone Deficits: shoulder flexion active to grossly 120 degrees bilaterally, using bilateral upper extremities with functional strength for bed mobility and transfers Left Upper Extremity Assessment LUE ROM/Strength/Tone: Deficits LUE ROM/Strength/Tone Deficits: shoulder flexion active to grossly 120 degrees bilaterally, using bilateral upper extremities with functional strength for bed mobility and transfers Right Lower Extremity Assessment RLE ROM/Strength/Tone: Deficits RLE ROM/Strength/Tone Deficits:  knee flexion contractures (grossly limited by 20-30 degrees from neutral) with rigidity bilateral LE noted RLE Sensation: WFL - Light Touch RLE Coordination: Deficits RLE Coordination Deficits: rigidity Left Lower Extremity Assessment LLE ROM/Strength/Tone: Deficits LLE ROM/Strength/Tone Deficits: knee flexion contracture (grossly limited by 20-30 degrees), more rigidity noted in this leg with hip maintained in external rotation, with heavy active assist pt able to get hip to neutral but not past it LLE Sensation: WFL - Light Touch LLE Coordination: Deficits LLE Coordination Deficits: rigidity Trunk Assessment Trunk Assessment: Kyphotic   Balance    End of Session PT - End of Session Equipment Utilized During Treatment: Gait belt Activity Tolerance: Patient tolerated treatment well Patient left: in bed;with call bell/phone within reach;with bed alarm set Nurse Communication: Mobility status;Need for lift equipment  GP     [facility] HELEN 05/25/2012, 11:47 AM

## 2012-05-25 NOTE — Progress Notes (Signed)
Subjective: Patient is sitting up in bed eating breakfast.  He says "my eyes are burning" and is asking for eye drops.  Patient denies any other pain.  He is awake, alert, oriented to self, place, month, and president.   Objective: Vital signs in last 24 hours: Temp:  [97.7 F (36.5 C)-98.7 F (37.1 C)] 97.7 F (36.5 C) (09/15 0700) Pulse Rate:  [56-65] 56  (09/15 0320) Resp:  [15-20] 15  (09/15 0320) BP: (132-153)/(39-59) 135/59 mmHg (09/15 0320) SpO2:  [92 %-95 %] 92 % (09/15 0320) Weight change:     Intake/Output from previous day: 09/14 0701 - 09/15 0700 In: 1200 [P.O.:1200] Out: 1200 [Urine:1200] Intake/Output this shift:    General appearance: alert, cooperative and no distress;  Resp: clear to auscultation bilaterally Cardio: regular rate and rhythm, S1, S2 normal, no murmur, click, rub or gallop GI: soft, non-tender; no masses,  no organomegaly Extremities: extremities normal, atraumatic, no cyanosis or edema Neurologic: Speech is slurred; otherwise CN 2-12 intact, LT hand tremor present that improves with movement; 5/5 strength in all 4 extremities  Lab Results:  Basename 05/23/12 0700 05/22/12 1400  WBC 6.6 6.4  HGB 12.6* 13.2  HCT 36.7* 38.7*  PLT 179 194   BMET  Basename 05/23/12 0700 05/22/12 1400  NA 139 138  K 3.9 4.4  CL 106 104  CO2 25 26  GLUCOSE 117* 91  BUN 15 17  CREATININE 0.72 0.81  CALCIUM 9.1 9.4    Medications:  Scheduled:   . carbidopa-levodopa  1 tablet Oral QID  . cholecalciferol  1,000 Units Oral Daily  . docusate sodium  100 mg Oral Daily  . enoxaparin (LOVENOX) injection  40 mg Subcutaneous Q24H  . escitalopram  20 mg Oral Daily  . feeding supplement  237 mL Oral BID BM  . multivitamin with minerals  1 tablet Oral Daily  . potassium chloride SA  20 mEq Oral Daily  . psyllium  1 packet Oral Daily  . QUEtiapine  25 mg Oral BID  . sodium chloride  3 mL Intravenous Q12H  . DISCONTD: QUEtiapine  25 mg Oral QHS    WUJ:[id number], acetaminophen, naphazoline-pheniramine, DISCONTD: naphazoline  Assessment/Plan: 71 y/o pt with hx of Parkinson's disease, dementia, seizures, depression, HTN and recent UTI admitted yesterday for episodes of hallucinations, paranoia with agitation and found to have bradycardia.   1. Delirium vs sundowning of dementia:  Patient appears to be at his baseline mentation.  No signs of infection that could have caused AMS on admission -  Normal WBC, no fevers, CXR stable no acute findings, Urine Cx pending. - Greatly appreciate Psychiatry consult: recommended Seroquel 50 XR today and low dose Risperdal PRN for agitation; outpatient work-up for dementia - We will call patient's neurologist on Monday for further recommendations and close follow up after D/C - Patient appears stable for transfer to telemetry floor today  2. Bradycardia:  HR currently 56.  EKG with ventricular bigeminy.  Greatly appreciate Cardiology consult: Not a pacemaker candidate for comorbidities per Cardiology recommendations.  ECHO showed grade 2 diastolic dysfunction, mild aortic regurgitation, moderated LVH and moderated dilated left atrium.  - Cardiology has signed off - Continue to monitor on telemetry  3. Hypertension: BP stable throughout hospital course. - Continue low sodium diet - Consider adding ACEi at discharge, avoid BB; will defer to Primary Team  FEN/GI: heart healthy diet/ saline lock Prophylaxis: lovenox Disposition:  Pending further Psych recommendations and placement per SW.  Will consult Nelva Bush  in AM.  Follow up PT for recommendations. For now can be transfer to telemetry floor.   LOS: 3 days   DE LA CRUZ,Micajah Dennin 05/25/2012, 8:57 AM

## 2012-05-25 NOTE — Consult Note (Signed)
Reason for Consult:AMS Referring Physician: unknown  Joseph [name] is an 71 y.o. male.  HPI: Joseph [name] is 71 y.o caucasian male with a history of Parkinson disease, dementia, seizures, depression, hypertension and recent UTI. He was admitted through ED after he had some hallucinations and episodes of paranoia at his Senior living environment ([facility]). The staff had called his Son to inform him of his fathers sudden decline in mental status. The staff reported he was seeing people that were not in the room and people that have passed away. He also was seeing the wall break away. His son reports his father has been declining mentally and physically since his his (patient's) wife passed away a little over a year ago. At that time he was living on his own and fell and broke his hip. After the hip surgery he had a rapid decline in health and mental function. He was diagnosed with parkinson's at that time and placed on a medication for it. He had a period in January where he was doing mentally better and was backed off some of his medications. Since then he again has declined.   He was seen in the ED last month and was diagnosed with a UTI. He has two urinalysis since then and they were negative. He was seen for a follow up to this with the urologist on the 27th of August and he was noted to have a heart rate in the 30's there. He was seen last week in [facility] for altered mental status and sent home.    Interval Hx:  Seen today and his chart was reviewed. appeared to be less confused and more calmer today. He is not sure where he is now and also why he is here. Not sure if he has medical problems including the need for being in hospital. Not sure if he needs help for walking or for his other limitations. Not sure why he lives in nursing home. . Pt denies any depression or psychotic symptoms now.   Past Medical History  Diagnosis Date  . Parkinson disease   . Seizures   . Depression   .  Hypertension   . Dementia   . UTI (lower urinary tract infection)     Past Surgical History  Procedure Date  . Hip fracture surgery     No family history on file.  Social History:  reports that he has never smoked. He does not have any smokeless tobacco history on file. He reports that he does not drink alcohol or use illicit drugs.  Allergies: No Known Allergies  Medications: I have reviewed the patient's current medications.  Results for orders placed during the hospital encounter of 05/22/12 (from the past 48 hour(s))  URINE CULTURE     Status: Normal   Collection Time   05/23/12  8:27 PM      Component Value Range Comment   Specimen Description URINE, CLEAN CATCH      Special Requests NONE      Culture  Setup Time 05/23/2012 21:26      Colony Count 30,000 COLONIES/ML      Culture        Value: Multiple bacterial morphotypes present, none predominant. Suggest appropriate recollection if clinically indicated.   Report Status 05/25/2012 FINAL       No results found.  Review of Systems  Cardiovascular: Negative.    Blood pressure 139/62, pulse 57, temperature 98.3 F (36.8 C), temperature source Oral, resp. rate 16, height  6\' 4"  (1.93 m), weight 106.1 kg (233 lb 14.5 oz), SpO2 95.00%. Physical Exam   Confused on bed lying  Assessment/Plan:      Mental Status Examination/Evaluation:   Appearance: on bed confused   Eye Contact::  poor   Speech: limited   Volume: low   Mood: no answer   Affect: ristricted   Thought Process: disorganized   Orientation: 1/4 only to person. Thinks it is 2032   Thought Content: denies AVH   Suicidal Thoughts: No   Homicidal Thoughts: no   Memory: Recent; Poor   Judgement: Impaired   Insight: Lacking   Psychomotor Activity: slow   Concentration: poor   Recall: poor   Akathisia: No   Assessment:   AXIS I: Delirium  NOS, cognitive d/o nos, r/o dementia AXIS II: Deferred   AXIS III:  see mdical hx       AXIS IV: unknown   AXIS V: 15       Treatment Plan/Recommendations:    - Seroquel is a good choice for him at this time based on hx of Parkinson. It may needs to be increased gradually later if needed. risperidone 0.25- 0.5 mg QHS can used for agiatation if needed (as PRN).    - Psy will continue to follow as needed  Wonda Cerise 05/25/2012, 2:25 PM

## 2012-05-25 NOTE — Progress Notes (Signed)
Seen and examined.  Discussed with son.  Appreciate psych note.  He continues with intermit ant agitation.   I believe he will do better if we disconnect him, so DC iv and telemetry.  Started aricept per psych rec.  Dispo will be the issue.  I doubt that he will be able to return to assisted living - but we shall see.

## 2012-05-26 DIAGNOSIS — R001 Bradycardia, unspecified: Secondary | ICD-10-CM

## 2012-05-26 DIAGNOSIS — G20 Parkinson's disease: Secondary | ICD-10-CM

## 2012-05-26 DIAGNOSIS — F0390 Unspecified dementia without behavioral disturbance: Secondary | ICD-10-CM

## 2012-05-26 MED ORDER — QUETIAPINE FUMARATE ER 50 MG PO TB24: 50.0000 mg | ORAL_TABLET | Freq: Every day | ORAL | Status: DC

## 2012-05-26 MED ORDER — POLYETHYLENE GLYCOL 3350 17 G PO PACK
17.0000 g | PACK | Freq: Once | ORAL | Status: AC
  Administered 2012-05-26: 17 g via ORAL
  Filled 2012-05-26: qty 1

## 2012-05-26 MED ORDER — CARBIDOPA-LEVODOPA 25-100 MG PO TABS: 1.0000 | ORAL_TABLET | Freq: Four times a day (QID) | ORAL | Status: DC

## 2012-05-26 MED ORDER — RISPERIDONE 1 MG/ML PO SOLN
0.2500 mg | Freq: Once | ORAL | Status: AC
  Administered 2012-05-27: 0.25 mg via ORAL
  Filled 2012-05-26: qty 0.25

## 2012-05-26 MED ORDER — DONEPEZIL HCL 5 MG PO TABS: 5.0000 mg | ORAL_TABLET | Freq: Every day | ORAL | Status: DC

## 2012-05-26 MED ORDER — RISPERIDONE 1 MG/ML PO SOLN: 0.2500 mg | Freq: Every day | ORAL | Status: DC

## 2012-05-26 NOTE — Progress Notes (Signed)
I received a call from the floor explaining Joseph [name] had become agitated, cursing and pulled his IV out. He has increased agitation this evening.  - Per Psychiatric recommendations, ordered Risperdal solution 0.25mg  QHS to help with his agitation.  - Will follow closely. - Restart IV in A.M. If needed.

## 2012-05-26 NOTE — Discharge Summary (Signed)
Physician Discharge Summary  Patient ID: Joseph [name] MRN: [id number] DOB/AGE: Mar 27, 1941 71 y.o.  Admit date: 05/22/2012 Discharge date: 05/26/2012  Admission Diagnoses: Altered Mental Status  Discharge Diagnoses:  Principal Problem:  *Dementia Active Problems:  PVC's (premature ventricular contractions)  Bigeminy  Parkinson disease, symptomatic  Bradycardia   Discharged Condition: fair  Hospital Course: Joseph [name] is 71 y.o caucasian male with a history of Parkinson disease, dementia, seizures, depression, hypertension and recent UTI. He presented to the ED  after he had some hallucinations and episodes of paranoia at his Senior living environment ([facility]) on the 9/12. Marland Kitchen He was seen in the ED last month and was diagnosed with a UTI. He has two urinalysis since then and they were negative. He was seen for a follow up to this with the urologist on the 27th of August and he was noted to have a heart rate in the 30's there. He was seen last week in [facility] for altered mental status and sent home.   He was admitted placed on Telemetry for his bradycardic events on 9/12 at [facility].  Cardiology was consulted and signed off, stating he was not a pacemaker candidate. He has episodes of bigeminy. Heart rate has varied between 40-70s. CT: showed stable atrophy without an acute process. His Sinemet dose was raised to four times a day (25-100 mg). Seroquel was added to his regimen per psychiatry recommendation, along with Aricept.  He has done well over the course with only a few initial episodes of agitation prior to the Seroquel administration. Its is the view of the team this is progression of his  Parkinson's and dementia.   Consults: cardiology and psychiatry  Significant Diagnostic Studies: Results for orders placed during the hospital encounter of 05/22/12 (from the past 72 hour(s))  URINE CULTURE     Status: Normal   Collection Time   05/23/12  8:27 PM      Component Value Range  Comment   Specimen Description URINE, CLEAN CATCH      Special Requests NONE      Culture  Setup Time 05/23/2012 21:26      Colony Count 30,000 COLONIES/ML      Culture        Value: Multiple bacterial morphotypes present, none predominant. Suggest appropriate recollection if clinically indicated.   Report Status 05/25/2012 FINAL       Leukocytes, UA  NEGATIVE  NEGATIVE  MICROSCOPIC NOT DONE ON URINES WITH NEGATIVE PROTEIN, BLOOD, LEUKOCYTES, NITRITE, OR GLUCOSE <1000 mg/dL.    Ct Head Wo Contrast  05/22/2012 *RADIOLOGY REPORT* Clinical Data: Confusion, decreased level of consciousness, difficult to arouse CT HEAD WITHOUT CONTRAST Technique: Contiguous axial images were obtained from the base of the skull through the vertex without contrast. Comparison: 01/04/2012 Findings: Diffuse brain atrophy evident involving the cerebrum and the cerebellum. No acute intracranial hemorrhage, mass lesion, focal edema, definite acute infarction, midline shift, herniation, hydrocephalus, or extra-axial fluid collection. Cisterns patent. Mastoid sinuses clear. Exam is limited with some motion artifact. IMPRESSION: Stable brain atrophy without acute process by noncontrast CT. Original Report Authenticated By: Joseph [name]. Joseph [name], M.D.   Dg Chest Portable 1 View  05/22/2012 *RADIOLOGY REPORT* Clinical Data: Altered mental status with history of Parkinson's disease. Dementia. PORTABLE CHEST - 1 VIEW Comparison: 05/14/2012 Findings: Cardiomegaly. Mild edema is stable. No infiltrates or overt failure. Osteopenia. No effusion or pneumothorax. IMPRESSION: Cardiomegaly with mild edema is stable. Original Report Authenticated By: Joseph [name], M.D.    Treatments:  IV hydration, Adjustment to Dementia and Parkinson medications  Discharge Exam: Blood pressure 141/55, pulse 62, temperature 98 F (36.7 C), temperature source Oral, resp. rate 18, height 6\' 4"  (1.93 m), weight 230 lb 13.2 oz (104.7 kg), SpO2 92.00%. General  appearance: Alert. Oriented this morning. Sitting in bedside chair taking a nap.  Resp: clear to auscultation bilaterally. No wheezing, Rhonchi or rales  Cardio: Bigeminy. Occasional bradycardia 45-60 HR. S1, S2 normal, no murmur, click, rub or gallop  GI: soft, non-tender; no masses, no organomegaly  Extremities: extremities normal, atraumatic, no cyanosis or edema  Neurologic: Speech is slurred; otherwise CN 2-12 intact, LT hand tremor present that improves with movement; 5/5 strength in all 4 extremities      Disposition: 03-Skilled Nursing Facility     Medication List     As of 05/26/2012 11:59 AM    TAKE these medications         carbidopa-levodopa 25-100 MG per tablet   Commonly known as: SINEMET IR   Take 1 tablet by mouth 4 (four) times daily.      cholecalciferol 1000 UNITS tablet   Commonly known as: VITAMIN D   Take 1,000 Units by mouth daily.      docusate sodium 100 MG capsule   Commonly known as: COLACE   Take 100 mg by mouth daily.      donepezil 5 MG tablet   Commonly known as: ARICEPT   Take 1 tablet (5 mg total) by mouth at bedtime.      escitalopram 20 MG tablet   Commonly known as: LEXAPRO   Take 20 mg by mouth daily.      ferrous sulfate 325 (65 FE) MG tablet   Take 325 mg by mouth 2 (two) times daily.      magnesium hydroxide 400 MG/5ML suspension   Commonly known as: MILK OF MAGNESIA   Take 30 mLs by mouth daily as needed. For constipation      multivitamin with minerals Tabs   Take 1 tablet by mouth daily.      polymixin-bacitracin 500-10000 UNIT/GM Oint ointment   Apply 1 application topically daily. Applied to affected area.      potassium chloride SA 20 MEQ tablet   Commonly known as: K-DUR,KLOR-CON   Take 20 mEq by mouth daily.      psyllium 0.52 G capsule   Commonly known as: REGULOID   Take 0.52 g by mouth daily.      QUEtiapine 50 MG Tb24   Commonly known as: SEROQUEL XR   Take 1 tablet (50 mg total) by mouth daily.          Follow-up Information    Follow up with [name], Joseph Curt, MD.   Contact information:   [address] [place] [address] [phone]          Signed: Felix [name] 05/26/2012, 11:59 AM

## 2012-05-26 NOTE — Progress Notes (Signed)
I have seen and examined this patient. I have discussed with Dr Claiborne Billings.  I agree with their findings and plans as documented in their progress note. Patient stable for discharge to memory unit of ALF or ICF.

## 2012-05-26 NOTE — Progress Notes (Signed)
Physical Therapy Treatment Patient Details Name: Joseph [name] MRN: [id number] DOB: Feb 02, 1941 Today's Date: 05/26/2012 Time: 0865-7846 PT Time Calculation (min): 21 min  PT Assessment / Plan / Recommendation Comments on Treatment Session  Pt seemed less able to participate today, with more confusion; Still, had been wanting OOB, and seems less restless in recliner    Follow Up Recommendations  Skilled nursing facility    Barriers to Discharge        Equipment Recommendations  Defer to next venue    Recommendations for Other Services    Frequency Min 3X/week   Plan Discharge plan remains appropriate    Precautions / Restrictions Precautions Precautions: Fall   Pertinent Vitals/Pain No specific reports    Mobility  Bed Mobility Bed Mobility: Supine to Sit;Sitting - Scoot to Edge of Bed Supine to Sit: 1: +2 Total assist Supine to Sit: Patient Percentage: 40% Sitting - Scoot to Edge of Bed: 3: Mod assist Details for Bed Mobility Assistance: continued need for cues and facilitation for sequencing of task; Pt still participating and held to therapist's hand to pull up Transfers Transfers: Stand Pivot Transfers Stand Pivot Transfers: 1: +2 Total assist Stand Pivot Transfers: Patient Percentage: 40% Details for Transfer Assistance: continued difficulty acheiving proper/optimal positioning of feet in prep for transfer as pt has LE hip and knee contractures, especially causing LLE to be externally rotated with difficulty getting foot flat on floor and difficulty separating feet for wider, more stable base of support; required use of bed pad  and knee block bilaterally Ambulation/Gait Ambulation/Gait Assistance:  (non-ambulatory at baseline)    Exercises     PT Diagnosis:    PT Problem List:   PT Treatment Interventions:     PT Goals Acute Rehab PT Goals Time For Goal Achievement: 06/08/12 Potential to Achieve Goals: Fair Pt will go Supine/Side to Sit: with min assist PT  Goal: Supine/Side to Sit - Progress: Goal set today Pt will go Sit to Supine/Side: with min assist PT Goal: Sit to Supine/Side - Progress: Goal set today Pt will Transfer Bed to Chair/Chair to Bed: with mod assist PT Transfer Goal: Bed to Chair/Chair to Bed - Progress: Progressing toward goal  Visit Information  Last PT Received On: 05/26/12 Assistance Needed: +2    Subjective Data  Subjective: Wanting OOB; somewhat perseverative on the type of truck to "get down the road" in   Cognition  Overall Cognitive Status: Impaired Area of Impairment: Attention;Safety/judgement;Awareness of errors;Awareness of deficits;Problem solving;Following commands Arousal/Alertness: Awake/alert Orientation Level: Disoriented to;Place;Time;Situation Behavior During Session: Restless Current Attention Level: Sustained Following Commands: Follows one step commands inconsistently;Follows multi-step commands inconsistently;Follows multi-step commands with increased time Safety/Judgement: Decreased awareness of safety precautions;Decreased safety judgement for tasks assessed;Decreased awareness of need for assistance;Impulsive    Balance     End of Session PT - End of Session Equipment Utilized During Treatment: Gait belt (bed pad) Activity Tolerance: Patient tolerated treatment well Patient left: in chair;with bed alarm set;with call bell/phone within reach;Other (comment) Psychiatrist in room) Nurse Communication: Mobility status   GP     Joseph [name], [facility] [phone]  05/26/2012, 4:32 PM

## 2012-05-26 NOTE — Clinical Social Work Psychosocial (Signed)
     Clinical Social Work Department BRIEF PSYCHOSOCIAL ASSESSMENT 05/26/2012  Patient:  Joseph [name], Joseph [name]     Account Number:  [id number]     Admit date:  05/22/2012  Clinical Social Worker:  Delmer Islam  Date/Time:  05/26/2012 05:51 AM  Referred by:  Physician  Date Referred:  05/26/2012 Referred for  SNF Placement   Other Referral:   Interview type:  Family Other interview type:   Talked with son Joseph [name] [phone])    PSYCHOSOCIAL DATA Living Status:  FACILITY Admitted from facility:  [facility] Level of care:  Assisted Living Primary support name:  Joseph [name] Primary support relationship to patient:  CHILD, ADULT Degree of support available:   Son very supportive and commited to patient having the best care based on his needs.    CURRENT CONCERNS Current Concerns  Post-Acute Placement   Other Concerns:    SOCIAL WORK ASSESSMENT / PLAN CSW talked with son several times during the day regarding d/c plans for patient and recommendation for SNF. Son reported that patient has been at [facility] siince November 2012 and is currently on Level 5 (there are 9 levels, plus a memory care unit).    Joseph [name]  inidcated that he wants to do what the professionals recommend and in line with that he has talked with [facility] and they are sending out an RN to evaluate patient to determine if they can still meet his needs at the current or a different level of care.  Son is concerned that a change in environment (SNF) would make his behaviors worst and hamper his progress. Per son, Joseph Dimmer, RN will be coming out to evaluate his dad. When CSW and son talked later same date the person coming out at 3 pm would be Joseph [name], Charity fundraiser.    CSW called ALF at 4:12 pm to determine if Joseph [name] would be coming out. CSW was able to talk with Joseph [name] and she was unaware that she was to come out. She also requested clinical information to review and this was faxed to her same date (including  FL-2 with meds). Joseph [name] also called the facility and spoke with Joseph [name] and he was advised that she would be coming out at 9:30 am on 9/17.   Assessment/plan status:  Psychosocial Support/Ongoing Assessment of Needs Other assessment/ plan:   Information/referral to community resources:    PATIENTS/FAMILYS RESPONSE TO PLAN OF CARE: Joseph [name] (son) was happy to talk with CSW regarding the most appropriate placement for his father. He had already begun the conversation with staff at [facility] and had the arrangments in place for them to come out and evaluate his dad.  **Joseph [name] will be out of town on 9/17 but advised CSW that he will be available by phone.

## 2012-05-26 NOTE — Progress Notes (Signed)
Subjective: Joseph [name] is awake and completed his entire breakfast this morning. He responds appropriately to all my questions. He reports he did not sleep last night because the sitter kept him up and would not feed him. This particular sitter was not there through the night and nursing reports he slept though the night. He did not need his PRN risperidone for agitation.   Objective: Vital signs in last 24 hours: Temp:  [97.7 F (36.5 C)-98.7 F (37.1 C)] 98.5 F (36.9 C) (09/16 0554) Pulse Rate:  [56-88] 57  (09/16 0554) Resp:  [15-18] 16  (09/16 0554) BP: (101-146)/(60-71) 140/60 mmHg (09/16 0554) SpO2:  [91 %-95 %] 91 % (09/16 0554) Weight:  [230 lb 13.2 oz (104.7 kg)] 230 lb 13.2 oz (104.7 kg) (09/15 2156) Weight change:     Intake/Output from previous day: 09/15 0701 - 09/16 0700 In: 880 [P.O.:880] Out: 1400 [Urine:1400] Intake/Output this shift: Total I/O In: 480 [P.O.:480] Out: 1400 [Urine:1400]  General appearance: alert, cooperative and no distress; No agitation this morning. Oriented x4. Resp: clear to auscultation bilaterally Cardio: regular rate and rhythm, S1, S2 normal, no murmur, click, rub or gallop GI: soft, non-tender; no masses,  no organomegaly Extremities: extremities normal, atraumatic, no cyanosis or edema Neurologic: Speech is slurred; otherwise CN 2-12 intact, LT hand tremor present that improves with movement; 5/5 strength in all 4 extremities  Lab Results:  Basename 05/23/12 0700  WBC 6.6  HGB 12.6*  HCT 36.7*  PLT 179   BMET  Basename 05/23/12 0700  NA 139  K 3.9  CL 106  CO2 25  GLUCOSE 117*  BUN 15  CREATININE 0.72  CALCIUM 9.1    Medications:  Scheduled:    . carbidopa-levodopa  1 tablet Oral QID  . cholecalciferol  1,000 Units Oral Daily  . docusate sodium  100 mg Oral Daily  . donepezil  5 mg Oral QHS  . enoxaparin (LOVENOX) injection  40 mg Subcutaneous Q24H  . escitalopram  20 mg Oral Daily  . feeding supplement  237  mL Oral BID BM  . multivitamin with minerals  1 tablet Oral Daily  . potassium chloride SA  20 mEq Oral Daily  . psyllium  1 packet Oral Daily  . QUEtiapine  50 mg Oral Daily  . DISCONTD: QUEtiapine  25 mg Oral BID  . DISCONTD: sodium chloride  3 mL Intravenous Q12H   ZOX:[id number], acetaminophen, naphazoline-pheniramine, DISCONTD: naphazoline  Assessment/Plan: 71 y/o pt with hx of Parkinson's disease, dementia, seizures, depression, HTN and recent UTI admitted yesterday for episodes of hallucinations, paranoia with agitation and found to have bradycardia.   1. Delirium vs sundowning of dementia:  Patient appears to be at his baseline mentation.  No signs of infection that could have caused AMS on admission -  Normal WBC, no fevers, CXR stable no acute findings, Urine Cx NEGATIVE. - Greatly appreciate Psychiatry consult: recommended Seroquel 50 XR today and low dose Risperdal PRN for agitation; outpatient work-up for dementia.  - We will call patient's neurologist on Monday for further recommendations and close follow up after D/C   2. Bradycardia:  HR currently 57 .  EKG with ventricular bigeminy.  Greatly appreciate Cardiology consult: Not a pacemaker candidate for comorbidities per Cardiology recommendations.  ECHO showed grade 2 diastolic dysfunction, mild aortic regurgitation, moderated LVH and moderated dilated left atrium.  - Cardiology has signed off - Continue to monitor on telemetry  3. Hypertension: BP stable throughout hospital course. - Continue low  sodium diet - Consider adding ACEi at discharge, avoid BB; will defer to Primary Team  FEN/GI: heart healthy diet/ saline lock Miralax PRN for constipation Prophylaxis: lovenox Disposition:  Pending further Psych recommendations. Consulted Norma this morning for SNF placement.   Follow up PT for recommendations. Hopefully back to [facility] (memory unit) or SNF today, His son which is power of attorney is out of town on  business tomorrow through Friday.   LOS: 4 days   Joseph [name] 05/26/2012, 6:25 AM

## 2012-05-27 MED ORDER — HYDROCHLOROTHIAZIDE 12.5 MG PO CAPS
12.5000 mg | ORAL_CAPSULE | Freq: Every day | ORAL | Status: DC
  Administered 2012-05-27 – 2012-05-29 (×3): 12.5 mg via ORAL
  Filled 2012-05-27 (×3): qty 1

## 2012-05-27 NOTE — Progress Notes (Signed)
Pt skin warm to touch. Rectal temp check for accuracy. Spoke with MD regarding pt status and plan. To continue observation status at this time. Will continue to monitor. Dondra Spry

## 2012-05-27 NOTE — Progress Notes (Signed)
FMTS CSW informed by Jodi Mourning, RN with [facility] that she will assess pt today at 9:30am. CSW to follow up after assessment to determine if pt is able to return to facility when medically stable.  Theresia Bough, MSW, Theresia [facility]ors [phone]

## 2012-05-27 NOTE — Progress Notes (Signed)
Clinical Social Worker (CSW) informed by Jodi Mourning with [facility] that pt will need a higher level of care before returning to the ALF. CSW spoke with pt son who was agreeable to recommendation and has given CSW consent to fax pt out to facilities in [facility] CSW will submit a pasarr and follow up with bed offers.  Theresia Bough, MSW, [facility] [phone]

## 2012-05-27 NOTE — Progress Notes (Signed)
Subjective: Joseph [name] required one dose of risperdal (0.25 mg) last night for agitation with the nurses. He is doing well today however and is calm. He has been eating nearly his entire meal and rested quietly this morning.   Objective: Vital signs in last 24 hours: Temp:  [97.5 F (36.4 C)-100.1 F (37.8 C)] 100.1 F (37.8 C) (09/17 1254) Pulse Rate:  [44-74] 52  (09/17 0900) Resp:  [16-20] 16  (09/17 0900) BP: (136-174)/(61-74) 150/67 mmHg (09/17 0900) SpO2:  [91 %-98 %] 98 % (09/17 0900) Weight:  [225 lb 5 oz (102.2 kg)] 225 lb 5 oz (102.2 kg) (09/16 2137) Weight change: -5 lb 8.2 oz (-2.5 kg) Last BM Date: 05/26/12  Intake/Output from previous day: 09/16 0701 - 09/17 0700 In: 1160 [P.O.:1160] Out: 1850 [Urine:1850] Intake/Output this shift: Total I/O In: 480 [P.O.:480] Out: 600 [Urine:600]  General appearance: resting quietly and no distress; No agitation this morning. Resp: clear to auscultation bilaterally Cardio: regular rate and rhythm, S1, S2 normal, no murmur, click, rub or gallop GI: soft, non-tender; no masses,  no organomegaly Extremities: extremities normal, atraumatic, no cyanosis or edema Neurologic: Speech is slurred; otherwise CN 2-12 intact, LT hand tremor present that improves with movement; 5/5 strength in all 4 extremities  Medications:  Scheduled:    . carbidopa-levodopa  1 tablet Oral QID  . cholecalciferol  1,000 Units Oral Daily  . docusate sodium  100 mg Oral Daily  . donepezil  5 mg Oral QHS  . enoxaparin (LOVENOX) injection  40 mg Subcutaneous Q24H  . escitalopram  20 mg Oral Daily  . feeding supplement  237 mL Oral BID BM  . multivitamin with minerals  1 tablet Oral Daily  . polyethylene glycol  17 g Oral Once  . potassium chloride SA  20 mEq Oral Daily  . psyllium  1 packet Oral Daily  . QUEtiapine  50 mg Oral Daily  . risperiDONE  0.25 mg Oral Once  . DISCONTD: risperiDONE  0.25 mg Oral QHS   BJY:[id number], acetaminophen,  naphazoline-pheniramine  Assessment/Plan: 71 y/o pt with hx of Parkinson's disease, dementia, seizures, depression, HTN and recent UTI admitted for episodes of worsening hallucinations, paranoia with agitation and found to have bradycardia.   1. Delirium vs sundowning of dementia:  Patient appears to be at his baseline mentation.  No signs of infection that could have caused AMS on admission -  Normal WBC, no fevers, CXR stable no acute findings, Urine Cx NEGATIVE. - Greatly appreciate Psychiatry consult: recommended Seroquel 50 XR and low dose Risperdal PRN for agitation; outpatient work-up for dementia.  - pt required one prn dose of risperdol last night for agitation and had a good response to it. We will continue this prn for agitation. -sitter discontinued in anticipation of d/c soon to SNF -consider outpatient follow-up with psychiatry to continue to evaluate med needs   2. Bradycardia:  HR in 40's-50's.  EKG with ventricular bigeminy.  Greatly appreciate Cardiology consult: Not a pacemaker candidate for comorbidities per Cardiology recommendations.  ECHO showed grade 2 diastolic dysfunction, mild aortic regurgitation, moderated LVH and moderated dilated left atrium.  - Cardiology has signed off - Pt no longer on telemetry as he was becoming more agitated with the tele box.   3. Hypertension: BP stable throughout hospital course. - Continue low sodium diet - Consider adding ACEi at discharge, avoid BB; will defer to Primary Team  FEN/GI: heart healthy diet/ no iv as patient pulls it out Miralax PRN  for constipation Prophylaxis: lovenox Disposition: Pt not able to return to [facility] at this time. They recommend SNF placement for physical rehab before they can take him back. SW has discussed this with patient's son, who is okay with SNF placement. SW currently seeking a facility to take Joseph [name].     LOS: 5 days   Myrtha Mantis 05/27/2012, 1:34 PM  Teaching Service  Addendum. I have seen and evaluated this pt and agree with MS note. My addended note is as follows.  Physical exam: Vital reviewed Gen: NAD. laying in bed. Answering properly our questions.  HEENT: Moist mucous membranes  CV: Regular rate and rhythm, no murmurs rubs or gallops  PULM: Clear to auscultation bilaterally. No wheezes/rales/rhonchi  ABD: Soft, non tender, non distended, normal bowel sounds  EXT: No edema. Small wound on left knee with proper dressing applied.  Neuro: Alert and oriented x2. Did not appreciate tremor  Assessment and Plan: 71 y/o pt with hx of Parkinson's disease, dementia, seizures, depression, HTN and recent UTI admitted  for episodes of hallucinations, paranoia with agitation and found to have bradycardia.   1. Delirium vs sundowning of dementia: Negative work up for causes of delirium. His actual state is his baseline per son's observation. Pt had 1 episode of agitation last night that required risperidone at doses recommended per psychiatry and his agitation resolved. No more events overnight or during the morning of today. Psychiatry will f/u as needed bases.   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 Not a candidate for pacemaker per cardiology recommendations. Off monitor since took it off and refuses to be back on. Asymptomatic otherwise.    3. HTN: controlled. Possible start  low dose HCTZ.   FEN/GI: heart healthy diet.  Prophylaxis: lovenox  Disposition: awaiting for placement since the ALF were he was does not have level of care pt needs. Our SW is helping with his placement.   D. [place] Rolene [facility], MD Family Medicine  PGY-2

## 2012-05-27 NOTE — Progress Notes (Signed)
Physical Therapy Treatment Patient Details Name: Joseph [name] MRN: [id number] DOB: 03-17-1941 Today's Date: 05/27/2012 Time: 4098-1191 PT Time Calculation (min): 20 min  PT Assessment / Plan / Recommendation Comments on Treatment Session  Less able to participate today tahn previous session; At this point, we must consider SNF level of care    Follow Up Recommendations  Skilled nursing facility    Barriers to Discharge        Equipment Recommendations  Defer to next venue    Recommendations for Other Services    Frequency Min 3X/week   Plan Discharge plan remains appropriate    Precautions / Restrictions Precautions Precautions: Fall Restrictions Weight Bearing Restrictions: No   Pertinent Vitals/Pain Difficult to discern    Mobility  Bed Mobility Bed Mobility: Rolling Left;Left Sidelying to Sit;Sit to Supine Rolling Left: 1: +2 Total assist Rolling Left: Patient Percentage: 50% Left Sidelying to Sit: 1: +2 Total assist Left Sidelying to Sit: Patient Percentage: 30% Sit to Supine: 1: +2 Total assist Sit to Supine: Patient Percentage: 30% Details for Bed Mobility Assistance: Decr ability to participate today, requiring heavy +2 assist with all mobility; Pt not necessarily resisting movement, but definitely not initiating or helping with movement Transfers Transfers: Not assessed Details for Transfer Assistance: Opted not to tranfers OOB today due to pt's decr ability to participate, and safety concerns with his decr functional status    Exercises     PT Diagnosis:    PT Problem List:   PT Treatment Interventions:     PT Goals Acute Rehab PT Goals Time For Goal Achievement: 06/08/12 Potential to Achieve Goals: Fair Pt will Roll Supine to Left Side: with min assist PT Goal: Rolling Supine to Left Side - Progress: Not progressing Pt will go Supine/Side to Sit: with min assist PT Goal: Supine/Side to Sit - Progress: Not progressing Pt will go Sit to Supine/Side:  with min assist PT Goal: Sit to Supine/Side - Progress: Not progressing  Visit Information  Last PT Received On: 05/27/12 Assistance Needed: +2    Subjective Data  Subjective: Less lucid this session, with inability to express a complete though   Cognition  Overall Cognitive Status: Impaired Orientation Level: Disoriented to;Place;Time;Situation Behavior During Session: Restless Following Commands: Follows one step commands inconsistently    Balance     End of Session PT - End of Session Equipment Utilized During Treatment:  (bed pad) Activity Tolerance:  (limited by restlessness) Patient left: in bed;with nursing in room;with call bell/phone within reach (sitter present; bed in semi-chair position) Nurse Communication: Mobility status   GP Functional Assessment Tool Used: Clinical Judgement Functional Limitation: Mobility: Walking and moving around Mobility: Walking and Moving Around Current Status (Y7829): At least 80 percent but less than 100 percent impaired, limited or restricted Mobility: Walking and Moving Around Goal Status [phone]): At least 20 percent but less than 40 percent impaired, limited or restricted   Van Clines [facility], [place] [phone]  05/27/2012, 11:55 AM

## 2012-05-28 NOTE — Progress Notes (Signed)
Clinical Social Worker (CSW) contacted pt son today to inform him of bed offers. CSW emailed the list to pt son who will contact CSW later today with a facility preference. CSW to continue following and awaiting a pasarr number.  Theresia Bough, MSW, Theresia Majors [phone]

## 2012-05-28 NOTE — Progress Notes (Signed)
Family Medicine Teaching Service  Inpatient Progress Note     Subjective:  Pt had no acute events overnight. He did not have any episodes of agitation or other problems. He is sleeping peacefully in his bed this morning.  Objective: Vital signs in last 24 hours: Temp:  [97.6 F (36.4 C)-100.1 F (37.8 C)] 97.6 F (36.4 C) (09/18 0620) Pulse Rate:  [45-60] 45  (09/18 0620) Resp:  [16-19] 18  (09/18 0620) BP: (122-152)/(66-72) 122/66 mmHg (09/18 0620) SpO2:  [91 %-98 %] 93 % (09/18 0620) Weight:  [228 lb 11.2 oz (103.738 kg)] 228 lb 11.2 oz (103.738 kg) (09/17 2142) Weight change: 3 lb 6.2 oz (1.538 kg) Last BM Date: 05/26/12  Intake/Output from previous day: 09/17 0701 - 09/18 0700 In: 960 [P.O.:960] Out: 1325 [Urine:1325] Intake/Output this shift:    General appearance: resting quietly and no distress; No agitation this morning. Resp: clear to auscultation bilaterally Cardio: regular rate and rhythm, S1, S2 normal, no murmur, click, rub or gallop GI: soft, non-tender; no masses,  no organomegaly Extremities: extremities normal, atraumatic, no cyanosis or edema Neurologic: Speech is slurred; otherwise CN 2-12 intact, LT hand tremor present that improves with movement; 5/5 strength in all 4 extremities  Medications:  Scheduled:    . carbidopa-levodopa  1 tablet Oral QID  . cholecalciferol  1,000 Units Oral Daily  . docusate sodium  100 mg Oral Daily  . donepezil  5 mg Oral QHS  . enoxaparin (LOVENOX) injection  40 mg Subcutaneous Q24H  . escitalopram  20 mg Oral Daily  . feeding supplement  237 mL Oral BID BM  . hydrochlorothiazide  12.5 mg Oral Daily  . multivitamin with minerals  1 tablet Oral Daily  . potassium chloride SA  20 mEq Oral Daily  . psyllium  1 packet Oral Daily  . QUEtiapine  50 mg Oral Daily   QMV:[id number], acetaminophen, naphazoline-pheniramine  Assessment/Plan: 71 y/o pt with hx of Parkinson's disease, dementia, seizures, depression, HTN  and recent UTI admitted for episodes of worsening hallucinations, paranoia with agitation and found to have bradycardia.   1. Delirium vs sundowning of dementia:  Patient appears to be at his baseline mentation.  No signs of infection that could have caused AMS on admission -  Normal WBC, no fevers, CXR stable no acute findings, Urine Cx NEGATIVE. - Greatly appreciate Psychiatry consult: recommended Seroquel 50 XR and low dose Risperdal PRN for agitation; outpatient work-up for dementia.  - pt did not require any prn risperdone last night (no episodes of acute agitation). -sitter discontinued in anticipation of d/c soon to SNF -consider outpatient follow-up with psychiatry to continue to evaluate med needs   2. Bradycardia:  HR in 40's-50's.  EKG with ventricular bigeminy.  Greatly appreciate Cardiology consult: Not a pacemaker candidate for comorbidities per Cardiology recommendations.  ECHO showed grade 2 diastolic dysfunction, mild aortic regurgitation, moderated LVH and moderated dilated left atrium.  - Cardiology has signed off - Pt no longer on telemetry as he was becoming more agitated with the tele box.   3. Hypertension: BP stable throughout hospital course. - Continue low sodium diet - Consider adding ACEi at discharge, avoid BB; will defer to Primary Team  FEN/GI: heart healthy diet/ no iv as patient pulls it out Miralax PRN for constipation Prophylaxis: lovenox Disposition: Pt not able to return to [facility] at this time. They recommend SNF placement for physical rehab before they can take him back. SW has discussed this with patient's  son, who is okay with SNF placement. SW currently seeking a facility to take Mr. Koerber.     LOS: 6 days   Myrtha Mantis 05/28/2012, 7:12 AM   PGY3 Addendum to MSIV note:   S: no complaints. No concerns per patient's nurse. He did not require risperdal overnight.  O: BP 122/66  Pulse 45  Temp 97.6 F (36.4 C) (Axillary)  Resp 18   Ht 6\' 4"  (1.93 m)  Wt 228 lb 11.2 oz (103.738 kg)  BMI 27.84 kg/m2  SpO2 93% General appearance: alert, cooperative and no distress, masked faces Lungs: clear to auscultation bilaterally Heart: regular rate and rhythm, S1, S2 normal, no murmur, click, rub or gallop Extremities: extremities normal, atraumatic, no cyanosis or edema Neurologic: Grossly normal, alert to person, place (hospital) and time.   A/P:  49 y/o pt with hx of Parkinson's disease, dementia, seizures, depression, HTN and recent UTI admitted for episodes of hallucinations, paranoia with agitation and found to have bradycardia.   1. Delirium vs sundowning of dementia: Negative work up for causes of delirium. His actual state is his baseline per son's observation. A: delirium resolving. Dementia stable.  P: continue nightly Seroquel and Risperdal prn.    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 Not a candidate for pacemaker per cardiology recommendations. Off monitor since took it off and refuses to be back on. Asymptomatic otherwise.   3. HTN: controlled.   FEN/GI: heart healthy diet.   Prophylaxis: lovenox   Disposition: awaiting SNF placement. The patient's LTAC will not be able to meet his care needs at this time. Our SW is helping with his placement.   [facility] Family Medicine PGY-3 11:43 AM 05/28/12

## 2012-05-28 NOTE — Progress Notes (Addendum)
Clinical Social Work Department CLINICAL SOCIAL WORK PLACEMENT NOTE 05/28/2012  Patient:  Joseph [name], Joseph [name]  Account Number:  [id number] Admit date:  05/22/2012  Clinical Social Worker:  Theresia Bough, Theresia Majors  Date/time:  05/28/2012 02:31 PM  Clinical Social Work is seeking post-discharge placement for this patient at the following level of care:   SKILLED NURSING   (*CSW will update this form in Epic as items are completed)   05/28/2012  Patient/family provided with [facility] Department of Clinical Social Work's list of facilities offering this level of care within the geographic area requested by the patient (or if unable, by the patient's family).  05/28/2012  Patient/family informed of their freedom to choose among providers that offer the needed level of care, that participate in Medicare, Medicaid or managed care program needed by the patient, have an available bed and are willing to accept the patient.  05/28/2012  Patient/family informed of [facility]' ownership interest in [facility], as well as of the fact that they are under no obligation to receive care at this facility.  PASARR submitted to EDS on 05/27/2012 PASARR number received from EDS on   FL2 transmitted to all facilities in geographic area requested by pt/family on  05/27/12 FL2 transmitted to all facilities within larger geographic area on   Patient informed that his/her managed care company has contracts with or will negotiate with  certain facilities, including the following:     Patient/family informed of bed offers received:  05/28/2012 Patient chooses bed at 05/28/12 Physician recommends and patient chooses bed at    Patient to be transferred to  [facility] on  05/29/12 Patient to be transferred to facility by [facility]  The following physician request were entered in Epic:   Additional Comments:  Theresia Bough, MSW, [employer] [phone]

## 2012-05-28 NOTE — Progress Notes (Signed)
Pt requesting to see the MD. MD, notified via page with call back. Rounding team to unit for eval. Joseph [name], Joseph [name]

## 2012-05-29 ENCOUNTER — Non-Acute Institutional Stay: Payer: Self-pay | Admitting: Family Medicine

## 2012-05-29 DIAGNOSIS — G20 Parkinson's disease: Secondary | ICD-10-CM

## 2012-05-29 DIAGNOSIS — R410 Disorientation, unspecified: Secondary | ICD-10-CM

## 2012-05-29 DIAGNOSIS — I10 Essential (primary) hypertension: Secondary | ICD-10-CM

## 2012-05-29 DIAGNOSIS — F0391 Unspecified dementia with behavioral disturbance: Secondary | ICD-10-CM | POA: Insufficient documentation

## 2012-05-29 DIAGNOSIS — R001 Bradycardia, unspecified: Secondary | ICD-10-CM

## 2012-05-29 DIAGNOSIS — F0280 Dementia in other diseases classified elsewhere without behavioral disturbance: Secondary | ICD-10-CM

## 2012-05-29 MED ORDER — HYDROCHLOROTHIAZIDE 12.5 MG PO CAPS: 12.5000 mg | ORAL_CAPSULE | Freq: Every day | ORAL | Status: DC

## 2012-05-29 NOTE — Assessment & Plan Note (Signed)
Deemed not requiring a pacemaker by cardiology consultant 05/2012

## 2012-05-29 NOTE — Assessment & Plan Note (Signed)
well controlled  

## 2012-05-29 NOTE — Progress Notes (Signed)
Patient ID: Joseph [name], male   DOB: [birth year], 71 y.o.   MRN: [id number] I interviewed and examined this patient and discussed the care plan with Dr. Cristal Ford and agree with assessment and plan as documented in the admission note for today.  Joseph [name] A. Joseph Slider, MD Family Medicine Teaching Service Attending  05/29/2012 10:29 PM

## 2012-05-29 NOTE — Assessment & Plan Note (Signed)
Improved on Quetiapine

## 2012-05-29 NOTE — Progress Notes (Signed)
I discussed with Dr Kuneff.  I agree with their plans documented in their progress note for today.  

## 2012-05-29 NOTE — Progress Notes (Signed)
Clinical Child psychotherapist (CSW) informed pt ready for dc to [facility]. CSW prepared and placed pt dc packet in pt shadow chart. CSW submitted pt dc summary via TLC to facility. CSW informed by Joseph [name] that pt family will be contacted to complete paperwork. CSW provided RN with facility contact information to give report. CSW left a message for pt son Joseph [name] to inform of dc. No further CSW needs. [facility] has been contacted for a 14:30 pick up and transport to [place].  CSW signing off.  Joseph [name], MSW, Joseph [name] [phone]

## 2012-05-29 NOTE — Progress Notes (Signed)
Physical Therapy Treatment Patient Details Name: Joseph [name] MRN: [id number] DOB: 06/02/1941 Today's Date: 05/29/2012 Time: 2841-3244 PT Time Calculation (min): 13 min  PT Assessment / Plan / Recommendation Comments on Treatment Session  Pt with some intermittent confusion today.  Plan is SNF today or tomorrow.    Follow Up Recommendations  Skilled nursing facility    Barriers to Discharge        Equipment Recommendations  Defer to next venue    Recommendations for Other Services    Frequency Min 3X/week   Plan Discharge plan remains appropriate    Precautions / Restrictions     Pertinent Vitals/Pain  Pt with no c/o pain.    Mobility       Exercises General Exercises - Lower Extremity Ankle Circles/Pumps: AAROM;Both;10 reps;Seated Long Arc Quad: AROM;AAROM;Both;10 reps;Seated Hip ABduction/ADduction: AROM;AAROM;10 reps;Seated Hip Flexion/Marching: AROM;20 reps;Seated;Both   PT Diagnosis:    PT Problem List:   PT Treatment Interventions:     PT Goals Acute Rehab PT Goals PT Transfer Goal: Bed to Chair/Chair to Bed - Progress: Other (comment) (addressed strengthening to work towards improving transfers)  Visit Information  Last PT Received On: 05/29/12    Subjective Data  Subjective: Pt up in chair upon arrival.   Cognition       Balance     End of Session PT - End of Session Activity Tolerance: Patient tolerated treatment well Patient left: in chair;with call bell/phone within reach   GP     [facility] LUBECK 05/29/2012, 11:27 AM

## 2012-05-29 NOTE — Progress Notes (Signed)
Report given to Elsie Lincoln at [facility].  All questions answered. Steele Berg RN

## 2012-05-29 NOTE — Progress Notes (Unsigned)
Patient ID: Joseph [name], male   DOB: [birth year], 71 y.o.   MRN: [id number] Family Medicine Teaching Service [facility] SNF Admission History and Physical  Patient name: Joseph [name] Medical record number: [id number] Date of birth: 06/18/1941 Age: 71 y.o. Gender: male  Primary Care Provider: Kimber Relic, MD   History of Present Illness: Joseph [name] is a 71 y.o. year old male with history of Parkinson's disease and dementia who presents to [facility] after a 4 day admission at [facility] for hallucinations and episodic paranoia. He previously lived at [facility] and had been treated as an outpatient for a UTI. During his admission, lab work and head CT, urine studies were all negative for a cause of delirium. It was felt that perhaps his underlying neurologic condition was to blame, so psychiatry was consulted for medication recommendations and patient was started on seroquel and aricept. His dose of sinemet was also increased from TID to QID. His agitation/hallucinations had resolved with these changes prior to discharge.   He was also noted to have bradycardia and bigeminy, therefore cardiology was consulted, and patient felt to not be a pacemaker candidate. Heart rate reported from 40s-70s.   Patient is from [facility] and his son [name] and daughter in law are very involved in his care. Patient is admitted to SNF for PT/strengthening since he has increased difficulty with transfers and is not currently appropriate for return to assisted living.    Patient Active Problem List  Diagnosis  . PVC's (premature ventricular contractions)  . Bigeminy  . Parkinson disease, symptomatic  . Dementia  . Bradycardia   Past Medical History: Past Medical History  Diagnosis Date  . Parkinson disease   . Seizures   . Depression   . Hypertension   . Dementia   . UTI (lower urinary tract infection)     Past Surgical History: Past Surgical History  Procedure Date  . Hip fracture surgery      Social History: History   Social History  . Marital Status: Widowed    Spouse Name: N/A    Number of Children: N/A  . Years of Education: N/A   Social History Main Topics  . Smoking status: Never Smoker   . Smokeless tobacco: Not on file  . Alcohol Use: No  . Drug Use: No  . Sexually Active:    Other Topics Concern  . Not on file   Social History Narrative  . No narrative on file    Family History: No family history on file.  Allergies: No Known Allergies  Current Outpatient Prescriptions  Medication Sig Dispense Refill  . carbidopa-levodopa (SINEMET IR) 25-100 MG per tablet Take 1 tablet by mouth 4 (four) times daily.  120 tablet  0  . cholecalciferol (VITAMIN D) 1000 UNITS tablet Take 1,000 Units by mouth daily.      Marland Kitchen docusate sodium (COLACE) 100 MG capsule Take 100 mg by mouth daily.        Marland Kitchen donepezil (ARICEPT) 5 MG tablet Take 1 tablet (5 mg total) by mouth at bedtime.  30 tablet  0  . escitalopram (LEXAPRO) 20 MG tablet Take 20 mg by mouth daily.        . ferrous sulfate 325 (65 FE) MG tablet Take 325 mg by mouth 2 (two) times daily.        . hydrochlorothiazide (MICROZIDE) 12.5 MG capsule Take 1 capsule (12.5 mg total) by mouth daily.  30 capsule  0  . magnesium hydroxide (MILK OF  MAGNESIA) 400 MG/5ML suspension Take 30 mLs by mouth daily as needed. For constipation      . Multiple Vitamin (MULTIVITAMIN WITH MINERALS) TABS Take 1 tablet by mouth daily.      . polymixin-bacitracin (POLYSPORIN) 500-10000 UNIT/GM OINT ointment Apply 1 application topically daily. Applied to affected area.      . potassium chloride SA (K-DUR,KLOR-CON) 20 MEQ tablet Take 20 mEq by mouth daily.       . psyllium (REGULOID) 0.52 G capsule Take 0.52 g by mouth daily.      . QUEtiapine (SEROQUEL XR) 50 MG TB24 Take 1 tablet (50 mg total) by mouth daily.  30 each  0   No current facility-administered medications for this visit.    Review Of Systems: Per HPI with the following  additions: He c/o right shoulder pain for several weeks that started after a turn/transfer at ALF  denies confusion, headache, falls, abdominal pain, dysuria, diarrhea, seizure activity, bruising, visual change.  Otherwise 12 point review of systems was performed and was unremarkable.  Physical Exam: Pulse: 62  Blood Pressure: 141/55        General: alert, cooperative and no distress HEENT: PERRLA, extra ocular movement intact, oropharynx clear, no lesions and trachea midline Heart: regularly irregular. Borderline bradycardia. No murmur auscultated. Lungs: clear to auscultation, no wheezes or rales and unlabored breathing Abdomen: abdomen is soft without significant tenderness, masses, organomegaly or guarding Extremities: no edema, redness or tenderness in the calves or thighs. Right shoulder active and passive ROM diminished to <90 degrees. Pain reproduced with lift-off test, empty can test Skin: two superficial skin tears covered with bandage on thoracic area.  Neurology: normal without focal findings, mental status, speech normal, alert and oriented x3, PERLA, sensation grossly normal and slight L>R resting tremor, slight UE cogwheeling, bradykinesia, legs with hyperonicity and patient unable to extend legs -15 degrees bilaterally.  Labs and Imaging: Lab Results  Component Value Date/Time   NA 139 05/23/2012  7:00 AM   K 3.9 05/23/2012  7:00 AM   CL 106 05/23/2012  7:00 AM   CO2 25 05/23/2012  7:00 AM   BUN 15 05/23/2012  7:00 AM   CREATININE 0.72 05/23/2012  7:00 AM   GLUCOSE 117* 05/23/2012  7:00 AM   Lab Results  Component Value Date   WBC 6.6 05/23/2012   HGB 12.6* 05/23/2012   HCT 36.7* 05/23/2012   MCV 90.6 05/23/2012   PLT 179 05/23/2012   Head CT: stable brain atrophy  CXR: cardiomegaly with mild edema  BNP 2460  Urine culture: 30 k multiple morphotypes  Assessment and Plan: Joseph [name] is a 71 y.o. year old male with history of Parkinson's disease and dementia who  presents to [facility] after a 4 day admission at [facility] for hallucinations and episodic paranoia, for physical therapy and rehabilitation.   1. Hallucination/delirium. No medical etiologies identified, likely secondary to declining dementia/parkinsons. No current symptoms.  Improved with medication titration with starting on seroquel. Will monitor clinically, consider increase in dose if required. Avoid anticholinergics and haldol  2. Parkinson's disease. Increased dose of sinemet during hospitalization. PT to evaluate and treat. Goal for return to ambulatory status.   3. Bigeminy/bradycardia. Not a candidate for pacemaker. Will avoid nodal blocking agents. Observe for symptoms. F/u electrolytes in one week.  4. Dementia. Started on aricept during hospitalization. Will f/u MMSE in 1-2 weeks after patient familiarizes with new environment.  5. Depression. Denies any current depression symptoms. On lexapro 20 mg  daily. Continue for now.   6. Right shoulder pain. Exam c/w rotator cuff tendonopathy. Will start tylenol/tramadol prn (avoid excess as may lead to serontonin syndrome). Start PT. Consider steroid injection if not improving.  7. Constipation. Psyllium fiver and colace.  8. Disposition. Plan for rehabilitation. Discussed with patient daughter-in-law at bedside today, questions answered.   Lloyd Huger, MD [facility] Resident - PGY-3 05/29/2012 9:39 PM

## 2012-05-29 NOTE — Progress Notes (Signed)
Nutrition Follow-up  Intervention:  Continue current interventions.  Assessment:   Nurse tech confirms he is consuming 100% and eating well. Pt planning to d/c to SNF today or tomorrow per chart review.  Diet Order:  Heart Healthy Supplements: Ensure PO BID  Meds: Scheduled Meds:   . carbidopa-levodopa  1 tablet Oral QID  . cholecalciferol  1,000 Units Oral Daily  . docusate sodium  100 mg Oral Daily  . donepezil  5 mg Oral QHS  . enoxaparin (LOVENOX) injection  40 mg Subcutaneous Q24H  . escitalopram  20 mg Oral Daily  . feeding supplement  237 mL Oral BID BM  . hydrochlorothiazide  12.5 mg Oral Daily  . multivitamin with minerals  1 tablet Oral Daily  . potassium chloride SA  20 mEq Oral Daily  . psyllium  1 packet Oral Daily  . QUEtiapine  50 mg Oral Daily   Continuous Infusions:  PRN Meds:.acetaminophen, acetaminophen, naphazoline-pheniramine  Labs:  CMP     Component Value Date/Time   NA 139 05/23/2012 0700   K 3.9 05/23/2012 0700   CL 106 05/23/2012 0700   CO2 25 05/23/2012 0700   GLUCOSE 117* 05/23/2012 0700   BUN 15 05/23/2012 0700   CREATININE 0.72 05/23/2012 0700   CALCIUM 9.1 05/23/2012 0700   PROT 6.5 05/22/2012 1400   ALBUMIN 3.5 05/22/2012 1400   AST 15 05/22/2012 1400   ALT 12 05/22/2012 1400   ALKPHOS 61 05/22/2012 1400   BILITOT 0.4 05/22/2012 1400   GFRNONAA >90 05/23/2012 0700   GFRAA >90 05/23/2012 0700     Intake/Output Summary (Last 24 hours) at 05/29/12 1226 Last data filed at 05/29/12 0900  Gross per 24 hour  Intake   1640 ml  Output    600 ml  Net   1040 ml  BM 9/16  Weight Status:  224 lb - wt down 9 lb x 6 days  Estimated needs:  2000 - 2200 kcal, 100 - 110 grams protein  Nutrition Dx:  Inadequate oral intake r/t AMS AEB no % meal intake recorded. Resolved.  Goal:  Oral intake with meals & supplements to meet >/= 90% of estimated nutrition needs - met.  Monitor:  PO & supplemental intake, weight, labs, I/O's  Jarold Motto MS, RD,  LDN Pager: [phone] After-hours pager: [phone]

## 2012-05-29 NOTE — Progress Notes (Signed)
Family Medicine Teaching Service  Inpatient Progress Note     Subjective:  Pt had [name] acute events overnight. Per report he had some agitation last night but was talked down without the need for PRN Risperdal. Today he complains of wetting the bed and [name] since he has been here. We discussed bedside urinal with the nurse and Joseph [name] does not believe he would be able to use one effectively. He has a record of a BM in the chart on 9/16.   Objective: Vital signs in last 24 hours: Temp:  [97.3 F (36.3 C)-98.9 F (37.2 C)] 98.9 F (37.2 C) (09/19 0520) Pulse Rate:  [55-61] 58  (09/19 0520) Resp:  [17-20] 17  (09/19 0520) BP: (116-151)/(62-81) 136/66 mmHg (09/19 0520) SpO2:  [92 %-95 %] 92 % (09/19 0520) Weight:  [224 lb 10.4 oz (101.9 kg)] 224 lb 10.4 oz (101.9 kg) (09/18 2039) Weight change: -4 lb 0.8 oz (-1.838 kg) Last BM Date: 05/26/12  Intake/Output from previous day: 09/18 0701 - 09/19 0700 In: 1360 [P.O.:1360] Out: 810 [Urine:810] Intake/Output this shift: Total I/O In: 240 [P.O.:240] Out: -   General appearance: Alert. Oriented this morning. Sitting in bedside chair taking a nap. Resp: clear to auscultation bilaterally. [name] wheezing, Rhonchi or rales Cardio: Bigeminy.  Occasional bradycardia 45-60 HR.  S1, S2 normal, [name] murmur, click, rub or gallop GI: soft, non-tender; [name] masses,  [name] organomegaly Extremities: extremities normal, atraumatic, [name] cyanosis or edema Neurologic: Speech is slurred; otherwise CN 2-12 intact, LT hand tremor present that improves with movement; 5/5 strength in all 4 extremities  Medications:  Scheduled:    . carbidopa-levodopa  1 tablet Oral QID  . cholecalciferol  1,000 Units Oral Daily  . docusate sodium  100 mg Oral Daily  . donepezil  5 mg Oral QHS  . enoxaparin (LOVENOX) injection  40 mg Subcutaneous Q24H  . escitalopram  20 mg Oral Daily  . feeding supplement  237 mL Oral BID BM  . hydrochlorothiazide  12.5 mg Oral Daily  .  multivitamin with minerals  1 tablet Oral Daily  . potassium chloride SA  20 mEq Oral Daily  . psyllium  1 packet Oral Daily  . QUEtiapine  50 mg Oral Daily   KGM:[id number], acetaminophen, naphazoline-pheniramine  Assessment/Plan: 71 y/o pt with hx of Parkinson's disease, dementia, seizures, depression, HTN and recent UTI admitted for episodes of worsening hallucinations, paranoia with agitation and found to have bradycardia.   1. Delirium vs sundowning of dementia:  Patient appears to be at his baseline mentation.  [name] signs of infection that could have caused AMS on admission -  Normal WBC, [name] fevers, CXR stable [name] acute findings, Urine Cx NEGATIVE. - Greatly appreciate Psychiatry consult: recommended Seroquel 50 XR and low dose Risperdal PRN for agitation; outpatient work-up for dementia.  - pt did not require any prn risperdone last night ([name] episodes of acute agitation). -sitter discontinued in anticipation of d/c soon to SNF -consider outpatient follow-up with psychiatry to continue to evaluate med needs   2. Bradycardia:  HR in 40's-50's.  EKG with ventricular bigeminy.  Greatly appreciate Cardiology consult: Not a pacemaker candidate for comorbidities per Cardiology recommendations.  ECHO showed grade 2 diastolic dysfunction, mild aortic regurgitation, moderated LVH and moderated dilated left atrium.  - Cardiology has signed off - Pt [name] longer on telemetry as he was becoming more agitated with the tele box.   3. Hypertension: BP stable throughout hospital course. - Continue low sodium diet -  Consider adding ACEi at discharge, avoid BB; will defer to Primary Team   FEN/GI: heart healthy diet/ [name] iv as patient pulls it out Miralax PRN for constipation - will continue as scheduled Prophylaxis: lovenox Disposition: Pt not able to return to [facility] at this time. They recommend SNF placement for physical rehab before they can take him back. SW has discussed this with patient's  son, who is okay with SNF placement. SW currently seeking a facility to take Joseph [name].     LOS: 7 days   Joseph [name] 05/29/2012, 8:28 AM

## 2012-05-29 NOTE — Discharge Summary (Signed)
I discussed with Dr Kuneff.  I agree with their plans documented in their progress note for today.  

## 2012-05-29 NOTE — Progress Notes (Signed)
Clinical Social Worker (CSW) received pt pasarr today, # [id number]. CSW also received pt ambulance authorization, # [id number]. CSW has informed pt son and [facility]. [facility] will contact pt son/daughter-in-law to complete admission paperwork at facility. CSW has informed Bjorn Loser with [facility] that pt is a FM pt and will be followed by Dr. Sheffield Slider. CSW has informed [facility] of pasarr being received. CSW to facilitate with dc to [facility] today.  Theresia Bough, MSW, Theresia Majors [phone]

## 2012-06-02 NOTE — Progress Notes (Signed)
Physical therapy ordered for evaluation for level of care required upon discharge. The patient had significant dementia due to Prakinson's disease PT recommended SNF placement and the patient to discharged to SNF.  Joseph [name] 1:50 PM 06/02/12

## 2012-06-06 ENCOUNTER — Non-Acute Institutional Stay: Payer: Self-pay | Admitting: Family Medicine

## 2012-06-06 DIAGNOSIS — I5030 Unspecified diastolic (congestive) heart failure: Secondary | ICD-10-CM | POA: Insufficient documentation

## 2012-06-06 NOTE — Progress Notes (Unsigned)
Patient ID: Joseph [name], male   DOB: 06-10-1941, 71 y.o.   MRN: [id number] [facility] SNF Discharge Summary   Patient ID: Joseph [name] [id number] 71 y.o. Dec 07, 1940  Admit date: 05/29/12  Discharge date and time: 06/06/12  Discharge Physician:  Dr. Zachery Dauer  Admission Diagnoses:   Patient Active Problem List  Diagnosis  . PVC's (premature ventricular contractions)  . Bigeminy  . Parkinson disease, symptomatic  . Dementia  . Bradycardia  . Hypertension  . Dementia in Parkinson's disease  . Dementia with behavioral disturbance  . Pulmonary edema   Discharge Diagnoses:  Same as above.  Admission Condition: good  Discharged Condition: good  Indication for Admission: Mr Joseph [name] is a pleasant 71 yo male with Parkinson's, dementia who was admitted to [facility] 05/29/12 after a 4 day admission at [facility] for hallucinations and episodic paranoia. Patient was transferred to SNF for PT/strengthening since he had increased difficulty with transfers and was not immediately appropriate for return to assisted living.   Hospital Course: During his hospital admission, lab work and head CT, urine studies were all negative for a cause of delirium. It was felt that perhaps his underlying neurologic condition was to blame, so psychiatry was consulted for medication recommendations and patient was started on seroquel and aricept. His dose of sinemet was also increased from TID to QID. His agitation/hallucinations had resolved with these changes. In the hospital, he was noted to have bradycardia and bigeminy, therefore cardiology was consulted, and patient felt to not be a pacemaker candidate. Heart rate reported from 40s-70s.   Patient had no recurrence of significant agitation or delirium while at SNF. He participated with PT/OT and felt to be stable back to his ALF on 06/06/12.   Patient is from [facility] and his son [name] and daughter in law are very involved in his care.   Consults:  PT/OT  Significant Diagnostic Studies: see hospital discharge summary, none since transfer to [facility]  Treatments: none  Discharge Exam: Vitals: pulse 55, BP 101/59, Temp 98.0, O2 99% RA General appearance: alert, cooperative, appears stated age and no distress Head: Normocephalic, without obvious abnormality, atraumatic Eyes: conjunctivae/corneas clear. PERRL, EOM's intact.  Throat: lips, mucosa, and tongue normal; teeth and gums normal Lungs: clear to auscultation bilaterally Heart: regular rate and rhythm, S1, S2 normal, no murmur, click, rub or gallop and pvcs Abdomen: soft, non-tender; bowel sounds normal; no masses,  no organomegaly Extremities: extremities normal, atraumatic, no cyanosis or edema Neurologic: Grossly normal  Disposition: [facility].   Patient Instructions:  Current Outpatient Prescriptions on File Prior to Visit  Medication Sig Dispense Refill  . carbidopa-levodopa (SINEMET IR) 25-100 MG per tablet Take 1 tablet by mouth 4 (four) times daily.  120 tablet  0  . cholecalciferol (VITAMIN D) 1000 UNITS tablet Take 1,000 Units by mouth daily.      Marland Kitchen docusate sodium (COLACE) 100 MG capsule Take 100 mg by mouth daily.        Marland Kitchen donepezil (ARICEPT) 5 MG tablet Take 1 tablet (5 mg total) by mouth at bedtime.  30 tablet  0  . escitalopram (LEXAPRO) 20 MG tablet Take 20 mg by mouth daily.        . ferrous sulfate 325 (65 FE) MG tablet Take 325 mg by mouth 2 (two) times daily.        . hydrochlorothiazide (MICROZIDE) 12.5 MG capsule Take 1 capsule (12.5 mg total) by mouth daily.  30 capsule  0  . magnesium  hydroxide (MILK OF MAGNESIA) 400 MG/5ML suspension Take 30 mLs by mouth daily as needed. For constipation      . Multiple Vitamin (MULTIVITAMIN WITH MINERALS) TABS Take 1 tablet by mouth daily.      . polymixin-bacitracin (POLYSPORIN) 500-10000 UNIT/GM OINT ointment Apply 1 application topically daily. Applied to affected area.      .  potassium chloride SA (K-DUR,KLOR-CON) 20 MEQ tablet Take 20 mEq by mouth daily.       . psyllium (REGULOID) 0.52 G capsule Take 0.52 g by mouth daily.      . QUEtiapine (SEROQUEL XR) 50 MG TB24 Take 1 tablet (50 mg total) by mouth daily.  30 each  0   Follow-up with PCP Dr. Chilton Si in 1-2 weeks.   SignedLloyd Huger PGY-3 06/06/2012 3:06 PM

## 2012-07-05 ENCOUNTER — Emergency Department (HOSPITAL_COMMUNITY)
Admission: EM | Admit: 2012-07-05 | Discharge: 2012-07-06 | Disposition: A | Discharge status: Home / Self Care | Payer: Medicare Other | Attending: Emergency Medicine | Admitting: Emergency Medicine

## 2012-07-05 ENCOUNTER — Encounter (HOSPITAL_COMMUNITY): Payer: Self-pay | Admitting: *Deleted

## 2012-07-05 DIAGNOSIS — Z79899 Other long term (current) drug therapy: Secondary | ICD-10-CM | POA: Insufficient documentation

## 2012-07-05 DIAGNOSIS — I10 Essential (primary) hypertension: Secondary | ICD-10-CM | POA: Insufficient documentation

## 2012-07-05 DIAGNOSIS — G40909 Epilepsy, unspecified, not intractable, without status epilepticus: Secondary | ICD-10-CM | POA: Insufficient documentation

## 2012-07-05 DIAGNOSIS — F3289 Other specified depressive episodes: Secondary | ICD-10-CM | POA: Insufficient documentation

## 2012-07-05 DIAGNOSIS — F329 Major depressive disorder, single episode, unspecified: Secondary | ICD-10-CM | POA: Insufficient documentation

## 2012-07-05 DIAGNOSIS — F912 Conduct disorder, adolescent-onset type: Secondary | ICD-10-CM | POA: Insufficient documentation

## 2012-07-05 DIAGNOSIS — F0280 Dementia in other diseases classified elsewhere without behavioral disturbance: Secondary | ICD-10-CM | POA: Insufficient documentation

## 2012-07-05 DIAGNOSIS — F69 Unspecified disorder of adult personality and behavior: Secondary | ICD-10-CM

## 2012-07-05 DIAGNOSIS — Z87440 Personal history of urinary (tract) infections: Secondary | ICD-10-CM | POA: Insufficient documentation

## 2012-07-05 NOTE — ED Notes (Signed)
Pt states that he would not answer our questions because he wanted to she how far we would go.

## 2012-07-05 NOTE — ED Provider Notes (Signed)
History     CSN: [id number]  Arrival date & time 07/05/12  2202   First MD Initiated Contact with Patient 07/05/12 2220      Chief Complaint  Patient presents with  . Altered Mental Status    (Consider location/radiation/quality/duration/timing/severity/associated sxs/prior treatment) HPI Altered Mental Status. Here with reports of being unresponsive from nursing home. Reports that pt was last seen well at 6 PM. Staff concerned about possible left side weakness. Pt arrived here not responsive to verbal stimulus. When attempting to look at pt pupils pt voluntarily tightening both eyes. Ammonia used and patient awakes stating "I just wanted to know how far you'd go." Able to move all 4 extremities. Pt reports no pain. Associated symptoms: No chest pain. No fever. No facial droop.   Past Medical History  Diagnosis Date  . Parkinson disease   . Seizures   . Depression   . Hypertension   . Dementia   . UTI (lower urinary tract infection)     Past Surgical History  Procedure Date  . Hip fracture surgery     No family history on file.  History  Substance Use Topics  . Smoking status: Never Smoker   . Smokeless tobacco: Not on file  . Alcohol Use: No      Review of Systems Negative for respiratory distress, cough. Negative for vomiting, diarrhea.  All other systems reviewed and negative unless noted in HPI.     Allergies  Review of patient's allergies indicates no known allergies.  Home Medications   Current Outpatient Rx  Name Route Sig Dispense Refill  . CARBIDOPA-LEVODOPA 25-100 MG PO TABS Oral Take 1 tablet by mouth 4 (four) times daily. 120 tablet 0  . VITAMIN D 1000 UNITS PO TABS Oral Take 1,000 Units by mouth daily.    Marland Kitchen DOCUSATE SODIUM 100 MG PO CAPS Oral Take 100 mg by mouth daily.      . DONEPEZIL HCL 5 MG PO TABS Oral Take 1 tablet (5 mg total) by mouth at bedtime. 30 tablet 0  . ESCITALOPRAM OXALATE 20 MG PO TABS Oral Take 20 mg by mouth daily.        Marland Kitchen FERROUS SULFATE 325 (65 FE) MG PO TABS Oral Take 325 mg by mouth 2 (two) times daily.      Marland Kitchen HYDROCHLOROTHIAZIDE 12.5 MG PO CAPS Oral Take 1 capsule (12.5 mg total) by mouth daily. 30 capsule 0  . MAGNESIUM HYDROXIDE 400 MG/5ML PO SUSP Oral Take 30 mLs by mouth daily as needed. For constipation    . ADULT MULTIVITAMIN W/MINERALS CH Oral Take 1 tablet by mouth daily.    . DOUBLE ANTIBIOTIC 500-10000 UNIT/GM EX OINT Topical Apply 1 application topically daily. Applied to affected area.    Marland Kitchen POTASSIUM CHLORIDE CRYS ER 20 MEQ PO TBCR Oral Take 20 mEq by mouth daily.     . PSYLLIUM 0.52 G PO CAPS Oral Take 0.52 g by mouth daily.    . QUETIAPINE FUMARATE ER 50 MG PO TB24 Oral Take 1 tablet (50 mg total) by mouth daily. 30 each 0    BP 165/73  Pulse 79  Temp 98.2 F (36.8 C) (Axillary)  Resp 16  SpO2 99%  Physical Exam Nursing note and vitals reviewed.  Constitutional: Pt is alert and appears stated age. Oropharynx: Airway open without erythema or exudate. Respiratory: No respiratory distress. Equal breathing bilaterally. CV: Extremities warm and well perfused. Neuro: No motor nor sensory deficit. Head: Normocephalic and atraumatic. Eyes:  No conjunctivitis, no scleral icterus. Neck: Supple, no mass. Chest: Non-tender. Abdomen: Soft, non-tender MSK: Extremities are atraumatic without deformity. Skin: No rash, no wounds.  ED Course  Procedures (including critical care time)  Labs Reviewed - No data to display No results found.   1. Behavior problem, adult       MDM  71 y.o. male brought here for AMS. Pt voluntarily unresponsive. Pt at baseline mental status after ammonia used. Pt's son arrived here and conformed that patient is at baseline. Discussed with son. No plan for work up. Will send back to nursing home.  \ Medical Decision Making discussed with ED attending Toy Baker, MD          Charm Barges, MD 07/06/12 [phone]

## 2012-07-05 NOTE — ED Notes (Signed)
Pt arrived from [facility] [place], via [place]/o altered mental status. Pt last seen well at 6pm. Pt able to speak clearly. Follow commands and wiggle digits.

## 2012-07-05 NOTE — ED Notes (Signed)
Pt returning to [facility] by way of [address]

## 2012-07-05 NOTE — ED Provider Notes (Signed)
I saw and evaluated the patient, reviewed the resident's note and I agree with the findings and plan.  Patient seen and evaluated by me. [name] discussed with the patient signs visit the patient says baseline. I spoke with the patient and inquired about what happened this evening he says that he just wanted attention and wanted to ' just fuck with people'. He admits that he was intensely the SAP bone keep his eyes closed and holding his breath. Stones states that he has had gradual decline for some time but feels that he is at his baseline. He is requesting that no medical evaluation began this time and that he be sent back to the facility  Toy Baker, MD 07/05/12 2319

## 2012-07-06 NOTE — ED Provider Notes (Signed)
I saw and evaluated the patient, reviewed the resident's note and I agree with the findings and plan.  Toy Baker, MD 07/06/12 2241

## 2012-07-07 LAB — GLUCOSE, CAPILLARY: Glucose-Capillary: 94 mg/dL (ref 70–99)

## 2012-12-13 ENCOUNTER — Emergency Department (HOSPITAL_COMMUNITY)
Admission: EM | Admit: 2012-12-13 | Discharge: 2012-12-13 | Disposition: A | Discharge status: Home / Self Care | Payer: Medicare Other | Attending: Emergency Medicine | Admitting: Emergency Medicine

## 2012-12-13 ENCOUNTER — Encounter (HOSPITAL_COMMUNITY): Payer: Self-pay | Admitting: *Deleted

## 2012-12-13 ENCOUNTER — Emergency Department (HOSPITAL_COMMUNITY): Payer: Medicare Other

## 2012-12-13 DIAGNOSIS — F3289 Other specified depressive episodes: Secondary | ICD-10-CM | POA: Insufficient documentation

## 2012-12-13 DIAGNOSIS — R071 Chest pain on breathing: Secondary | ICD-10-CM | POA: Insufficient documentation

## 2012-12-13 DIAGNOSIS — Z87440 Personal history of urinary (tract) infections: Secondary | ICD-10-CM | POA: Insufficient documentation

## 2012-12-13 DIAGNOSIS — Z8669 Personal history of other diseases of the nervous system and sense organs: Secondary | ICD-10-CM | POA: Insufficient documentation

## 2012-12-13 DIAGNOSIS — R0602 Shortness of breath: Secondary | ICD-10-CM | POA: Insufficient documentation

## 2012-12-13 DIAGNOSIS — F0390 Unspecified dementia without behavioral disturbance: Secondary | ICD-10-CM | POA: Insufficient documentation

## 2012-12-13 DIAGNOSIS — R05 Cough: Secondary | ICD-10-CM | POA: Insufficient documentation

## 2012-12-13 DIAGNOSIS — Z79899 Other long term (current) drug therapy: Secondary | ICD-10-CM | POA: Insufficient documentation

## 2012-12-13 DIAGNOSIS — G20A1 Parkinson's disease without dyskinesia, without mention of fluctuations: Secondary | ICD-10-CM | POA: Insufficient documentation

## 2012-12-13 DIAGNOSIS — I10 Essential (primary) hypertension: Secondary | ICD-10-CM | POA: Insufficient documentation

## 2012-12-13 DIAGNOSIS — R0789 Other chest pain: Secondary | ICD-10-CM

## 2012-12-13 DIAGNOSIS — R059 Cough, unspecified: Secondary | ICD-10-CM | POA: Insufficient documentation

## 2012-12-13 DIAGNOSIS — R609 Edema, unspecified: Secondary | ICD-10-CM | POA: Insufficient documentation

## 2012-12-13 DIAGNOSIS — J40 Bronchitis, not specified as acute or chronic: Secondary | ICD-10-CM

## 2012-12-13 DIAGNOSIS — F329 Major depressive disorder, single episode, unspecified: Secondary | ICD-10-CM | POA: Insufficient documentation

## 2012-12-13 DIAGNOSIS — J209 Acute bronchitis, unspecified: Secondary | ICD-10-CM | POA: Insufficient documentation

## 2012-12-13 DIAGNOSIS — G20 Parkinson's disease: Secondary | ICD-10-CM | POA: Insufficient documentation

## 2012-12-13 LAB — COMPREHENSIVE METABOLIC PANEL
ALT: 9 U/L (ref 0–53)
Albumin: 3.3 g/dL — ABNORMAL LOW (ref 3.5–5.2)
Alkaline Phosphatase: 53 U/L (ref 39–117)
BUN: 14 mg/dL (ref 6–23)
Chloride: 106 mEq/L (ref 96–112)
GFR calc Af Amer: >90 mL/min (ref >90)
Glucose, Bld: 93 mg/dL (ref 70–99)
Potassium: 4.2 mEq/L (ref 3.5–5.1)
Sodium: 139 mEq/L (ref 135–145)
Total Bilirubin: 0.4 mg/dL (ref 0.3–1.2)
Total Protein: 6.4 g/dL (ref 6.0–8.3)

## 2012-12-13 LAB — URINALYSIS, ROUTINE W REFLEX MICROSCOPIC
Glucose, UA: NEGATIVE mg/dL
Hgb urine dipstick: NEGATIVE
Leukocytes, UA: NEGATIVE
Protein, ur: NEGATIVE mg/dL
pH: 6 (ref 5.0–8.0)

## 2012-12-13 LAB — CBC WITH DIFFERENTIAL/PLATELET
Hemoglobin: 12.9 g/dL — ABNORMAL LOW (ref 13.0–17.0)
Lymphocytes Relative: 14 % (ref 12–46)
Lymphs Abs: 1.2 10*3/uL (ref 0.7–4.0)
Monocytes Relative: 9 % (ref 3–12)
Neutro Abs: 6 10*3/uL (ref 1.7–7.7)
Neutrophils Relative %: 73 % (ref 43–77)
Platelets: 190 10*3/uL (ref 150–400)
RBC: 4.19 MIL/uL — ABNORMAL LOW (ref 4.22–5.81)
WBC: 8.2 10*3/uL (ref 4.0–10.5)

## 2012-12-13 LAB — D-DIMER, QUANTITATIVE: D-Dimer, Quant: 2.06 ug/mL-FEU — ABNORMAL HIGH (ref 0.00–0.48)

## 2012-12-13 MED ORDER — CEPHALEXIN 500 MG PO CAPS: 500.0000 mg | ORAL_CAPSULE | Freq: Four times a day (QID) | ORAL | Status: DC

## 2012-12-13 MED ORDER — IOHEXOL 350 MG/ML SOLN
100.0000 mL | Freq: Once | INTRAVENOUS | Status: AC | PRN
  Administered 2012-12-13: 100 mL via INTRAVENOUS

## 2012-12-13 MED ORDER — AMOXICILLIN 500 MG PO CAPS: 500.0000 mg | ORAL_CAPSULE | Freq: Three times a day (TID) | ORAL | Status: DC

## 2012-12-13 NOTE — ED Notes (Signed)
New and old EKG given to Dr. Plunkett. Copy placed in pt chart. 

## 2012-12-13 NOTE — ED Provider Notes (Signed)
History     CSN: [id number]  Arrival date & time 12/13/12  0413   First MD Initiated Contact with Patient 12/13/12 0421      Chief Complaint  Patient presents with  . Chest Pain    (Consider location/radiation/quality/duration/timing/severity/associated sxs/prior treatment) Patient is a 72 y.o. male presenting with chest pain. The history is provided by the patient.  Chest Pain Pain location:  L chest Pain quality: sharp, stabbing and throbbing   Pain radiates to:  Does not radiate Pain radiates to the back: no   Pain severity:  Moderate Onset quality:  Sudden Duration:  30 minutes Timing:  Constant Progression:  Unchanged Chronicity:  New Context: breathing   Context: no movement   Relieved by:  Nothing Worsened by:  Coughing and deep breathing Associated symptoms: cough and shortness of breath   Associated symptoms: no abdominal pain, no diaphoresis, no fever, no nausea, not vomiting and no weakness   Risk factors: immobilization   Risk factors: no coronary artery disease, no diabetes mellitus, no hypertension, no prior DVT/PE and no surgery     Past Medical History  Diagnosis Date  . Parkinson disease   . Seizures   . Depression   . Hypertension   . Dementia   . UTI (lower urinary tract infection)     Past Surgical History  Procedure Laterality Date  . Hip fracture surgery      No family history on file.  History  Substance Use Topics  . Smoking status: Never Smoker   . Smokeless tobacco: Not on file  . Alcohol Use: Yes      Review of Systems  Constitutional: Negative for fever and diaphoresis.  HENT: Positive for congestion.   Respiratory: Positive for cough and shortness of breath.        State cough for the last week and then sob for last 2 days  Cardiovascular: Positive for chest pain.  Gastrointestinal: Negative for nausea, vomiting and abdominal pain.  Neurological: Negative for weakness.    Allergies  Review of patient's allergies  indicates no known allergies.  Home Medications   Current Outpatient Rx  Name  Route  Sig  Dispense  Refill  . carbidopa-levodopa (SINEMET IR) 25-100 MG per tablet   Oral   Take 1 tablet by mouth 4 (four) times daily.   120 tablet   0   . cholecalciferol (VITAMIN D) 1000 UNITS tablet   Oral   Take 1,000 Units by mouth daily.         Marland Kitchen docusate sodium (COLACE) 100 MG capsule   Oral   Take 100 mg by mouth daily.           Marland Kitchen donepezil (ARICEPT) 5 MG tablet   Oral   Take 1 tablet (5 mg total) by mouth at bedtime.   30 tablet   0   . escitalopram (LEXAPRO) 20 MG tablet   Oral   Take 20 mg by mouth daily.           . ferrous sulfate 325 (65 FE) MG tablet   Oral   Take 325 mg by mouth 2 (two) times daily.           . Multiple Vitamin (MULTIVITAMIN WITH MINERALS) TABS   Oral   Take 1 tablet by mouth daily.         . potassium chloride SA (K-DUR,KLOR-CON) 20 MEQ tablet   Oral   Take 20 mEq by mouth daily.          Marland Kitchen  psyllium (REGULOID) 0.52 G capsule   Oral   Take 0.52 g by mouth daily.         . QUEtiapine (SEROQUEL) 50 MG tablet   Oral   Take 50 mg by mouth daily.           BP 150/56  Pulse 69  Temp(Src) 98.4 F (36.9 C) (Oral)  Resp 20  SpO2 97%  Physical Exam  Nursing note and vitals reviewed. Constitutional: He is oriented to person, place, and time. He appears well-developed and well-nourished. No distress.  HENT:  Head: Normocephalic and atraumatic.  Mouth/Throat: Oropharynx is clear and moist.  Eyes: Conjunctivae and EOM are normal. Pupils are equal, round, and reactive to light.  Neck: Normal range of motion. Neck supple.  Cardiovascular: Normal rate, regular rhythm and intact distal pulses.   No murmur heard. Pulmonary/Chest: Effort normal. No respiratory distress. He has decreased breath sounds in the left lower field. He has no wheezes. He has no rales. He exhibits no tenderness.  Abdominal: Soft. He exhibits no distension.  There is no tenderness. There is no rebound and no guarding.  Musculoskeletal: Normal range of motion. He exhibits edema. He exhibits no tenderness.  2+ edema in contractures in bilateral lower ext  Neurological: He is alert and oriented to person, place, and time.  Skin: Skin is warm and dry. No rash noted. No erythema.  Psychiatric: He has a normal mood and affect. His behavior is normal.    ED Course  Procedures (including critical care time)  Labs Reviewed  CBC WITH DIFFERENTIAL - Abnormal; Notable for the following:    RBC 4.19 (*)    Hemoglobin 12.9 (*)    HCT 37.5 (*)    All other components within normal limits  COMPREHENSIVE METABOLIC PANEL - Abnormal; Notable for the following:    Albumin 3.3 (*)    GFR calc non Af Amer 89 (*)    All other components within normal limits  D-DIMER, QUANTITATIVE - Abnormal; Notable for the following:    D-Dimer, Quant 2.06 (*)    All other components within normal limits  URINALYSIS, ROUTINE W REFLEX MICROSCOPIC  POCT I-STAT TROPONIN I   Dg Chest 2 View  12/13/2012  *RADIOLOGY REPORT*  Clinical Data: Chest pain and shortness of breath since midnight.  CHEST - 2 VIEW  Comparison: 05/22/2012  Findings: Shallow inspiration.  Cardiac enlargement with pulmonary vascular congestion and interstitial changes suggesting edema. Pattern is stable since previous study. Small bilateral pleural effusions.  No pneumothorax.  Mediastinal contours appear intact. No focal consolidation.  Degenerative changes in the shoulders.  IMPRESSION: Cardiac enlargement with pulmonary vascular congestion and interstitial edema.  Small bilateral pleural effusions.   Original Report Authenticated By: Burman Nieves, M.D.    Ct Angio Chest Pe W/cm &/or Wo Cm  12/13/2012  *RADIOLOGY REPORT*  Clinical Data: Left chest pain.  Cough.  CT ANGIOGRAPHY CHEST  Technique:  Multidetector CT imaging of the chest using the standard protocol during bolus administration of intravenous  contrast. Multiplanar reconstructed images including MIPs were obtained and reviewed to evaluate the vascular anatomy.  Contrast: OMNIPAQUE IOHEXOL 350 MG/ML SOLN  Comparison: Chest radiograph 12/13/2012  Findings: There are no filling defects within the pulmonary arteries to suggest acute pulmonary embolism.  Pulmonary arteries are mildly enlarged at 30 mm on the right and 29 mm on the left. No acute findings of the aorta or great vessels.  Coronary artery calcification noted.  No pericardial fluid.  Esophagus is normal. No mediastinal lymphadenopathy.  No axillary supraclavicular adenopathy.  Review of the lung parenchyma demonstrates interlobular septal thickening.  There is bibasilar dependent pleural fluid and mild atelectasis.  There is no peribronchial thickening in the lower lobes.  Limited view of the upper abdomen is unremarkable.  Limited view of the skeleton demonstrates degenerative change of the spine.  IMPRESSION:  1.  No evidence of acute pulmonary embolism. 2.  Interstitial edema 3.  Peribronchial thickening in the lower lobes could represent bronchitis. 4.  Small effusions.  5. Coronary artery calcifications.   Original Report Authenticated By: Genevive Bi, M.D.      Date: 12/13/2012  Rate: 82  Rhythm: sinus tachycardia, premature ventricular contractions (PVC) and bigeminy  QRS Axis: normal  Intervals: normal  ST/T Wave abnormalities: indeterminate  Conduction Disutrbances:none  Narrative Interpretation:   Old EKG Reviewed: now more prevalent PVC's.  artifact present   1. Bronchitis   2. Chest wall pain       MDM   Patient presents today complaining of a sided chest pain that is pleuritic in nature it started tonight. However he states he has been coughing for several days and been short of breath over the last 2 days.  Patient denies any fever but states he thinks he is getting over bronchitis. He has no symptoms concerning for cardiac etiology at this time.  However EKG does show bigeminy. Looking at prior EKGs he had frequent PVCs in the past but now is in bigeminy. Also concerned more so for infectious etiology versus PE as patient is bedbound.  CBC, CMP, d-dimer, troponin, chest x-ray pending  7:17 AM Labs wnl except for elevated d-dimer.  CT showed bronchitis but no PE.  Most likely bronchitis and pleurisy.  Pt also has foul smelling urine and UA shows no signs of UTI.  Will start on amoxicillin due to being on seroquel and having bigemy and do not want to cause prolonged qt with azithro.      Gwyneth Sprout, MD 12/13/12 [id number]

## 2012-12-13 NOTE — ED Notes (Signed)
Called report to [facility]

## 2012-12-13 NOTE — ED Notes (Signed)
Called [facility] to transport pt back to NH 

## 2012-12-13 NOTE — ED Notes (Addendum)
Per EMS:  Pt from [facility] [place].  Pt stated he was having chest pain on the left side 10 minutes before EMS arrival.  Increased with palpation and movement.  Pts been coughing for a few days.  Non-productive cough.  Pt states he thinks he has bronchitis. Pt is in bigeminy. 150/90, HR 47, RR 16.  20 gauge in right hand. 4L O2 nasal cannula.  Pt was given 0.4mg  Nitroglycerin, nursing facility gave 325mg  aspirin.

## 2013-03-19 ENCOUNTER — Emergency Department (HOSPITAL_COMMUNITY): Payer: Medicare Other

## 2013-03-19 ENCOUNTER — Observation Stay (HOSPITAL_COMMUNITY)
Admission: EM | Admit: 2013-03-19 | Discharge: 2013-03-20 | Payer: Medicare Other | Attending: Internal Medicine | Admitting: Internal Medicine

## 2013-03-19 ENCOUNTER — Encounter (HOSPITAL_COMMUNITY): Payer: Self-pay | Admitting: Emergency Medicine

## 2013-03-19 DIAGNOSIS — J811 Chronic pulmonary edema: Secondary | ICD-10-CM | POA: Insufficient documentation

## 2013-03-19 DIAGNOSIS — F0390 Unspecified dementia without behavioral disturbance: Secondary | ICD-10-CM

## 2013-03-19 DIAGNOSIS — G20 Parkinson's disease: Secondary | ICD-10-CM

## 2013-03-19 DIAGNOSIS — F3289 Other specified depressive episodes: Secondary | ICD-10-CM | POA: Insufficient documentation

## 2013-03-19 DIAGNOSIS — R52 Pain, unspecified: Secondary | ICD-10-CM | POA: Insufficient documentation

## 2013-03-19 DIAGNOSIS — Z79899 Other long term (current) drug therapy: Secondary | ICD-10-CM | POA: Insufficient documentation

## 2013-03-19 DIAGNOSIS — G8929 Other chronic pain: Secondary | ICD-10-CM | POA: Insufficient documentation

## 2013-03-19 DIAGNOSIS — F0281 Dementia in other diseases classified elsewhere with behavioral disturbance: Secondary | ICD-10-CM | POA: Insufficient documentation

## 2013-03-19 DIAGNOSIS — G20A1 Parkinson's disease without dyskinesia, without mention of fluctuations: Secondary | ICD-10-CM

## 2013-03-19 DIAGNOSIS — M25519 Pain in unspecified shoulder: Secondary | ICD-10-CM | POA: Insufficient documentation

## 2013-03-19 DIAGNOSIS — IMO0002 Reserved for concepts with insufficient information to code with codable children: Secondary | ICD-10-CM | POA: Insufficient documentation

## 2013-03-19 DIAGNOSIS — I517 Cardiomegaly: Secondary | ICD-10-CM | POA: Insufficient documentation

## 2013-03-19 DIAGNOSIS — F0280 Dementia in other diseases classified elsewhere without behavioral disturbance: Secondary | ICD-10-CM | POA: Insufficient documentation

## 2013-03-19 DIAGNOSIS — X131XXA Other contact with steam and other hot vapors, initial encounter: Secondary | ICD-10-CM | POA: Insufficient documentation

## 2013-03-19 DIAGNOSIS — I10 Essential (primary) hypertension: Secondary | ICD-10-CM

## 2013-03-19 DIAGNOSIS — I493 Ventricular premature depolarization: Secondary | ICD-10-CM

## 2013-03-19 DIAGNOSIS — F329 Major depressive disorder, single episode, unspecified: Secondary | ICD-10-CM | POA: Insufficient documentation

## 2013-03-19 DIAGNOSIS — F02818 Dementia in other diseases classified elsewhere, unspecified severity, with other behavioral disturbance: Secondary | ICD-10-CM | POA: Insufficient documentation

## 2013-03-19 DIAGNOSIS — G9341 Metabolic encephalopathy: Principal | ICD-10-CM

## 2013-03-19 DIAGNOSIS — F03918 Unspecified dementia, unspecified severity, with other behavioral disturbance: Secondary | ICD-10-CM

## 2013-03-19 DIAGNOSIS — X12XXXA Contact with other hot fluids, initial encounter: Secondary | ICD-10-CM | POA: Insufficient documentation

## 2013-03-19 DIAGNOSIS — R001 Bradycardia, unspecified: Secondary | ICD-10-CM

## 2013-03-19 DIAGNOSIS — R4182 Altered mental status, unspecified: Secondary | ICD-10-CM

## 2013-03-19 DIAGNOSIS — G9340 Encephalopathy, unspecified: Secondary | ICD-10-CM | POA: Diagnosis present

## 2013-03-19 DIAGNOSIS — F0391 Unspecified dementia with behavioral disturbance: Secondary | ICD-10-CM

## 2013-03-19 LAB — CBC WITH DIFFERENTIAL/PLATELET
Basophils Relative: 0 % (ref 0–1)
HCT: 40.3 % (ref 39.0–52.0)
Hemoglobin: 13.9 g/dL (ref 13.0–17.0)
Lymphocytes Relative: 17 % (ref 12–46)
Lymphs Abs: 1.3 10*3/uL (ref 0.7–4.0)
Monocytes Absolute: 0.8 10*3/uL (ref 0.1–1.0)
Monocytes Relative: 10 % (ref 3–12)
Neutro Abs: 5.4 10*3/uL (ref 1.7–7.7)
Neutrophils Relative %: 69 % (ref 43–77)
RBC: 4.54 MIL/uL (ref 4.22–5.81)
WBC: 7.8 10*3/uL (ref 4.0–10.5)

## 2013-03-19 LAB — COMPREHENSIVE METABOLIC PANEL
Albumin: 3.4 g/dL — ABNORMAL LOW (ref 3.5–5.2)
Alkaline Phosphatase: 50 U/L (ref 39–117)
BUN: 18 mg/dL (ref 6–23)
CO2: 27 mEq/L (ref 19–32)
Chloride: 103 mEq/L (ref 96–112)
GFR calc non Af Amer: 84 mL/min — ABNORMAL LOW (ref >90)
Glucose, Bld: 95 mg/dL (ref 70–99)
Potassium: 3.7 mEq/L (ref 3.5–5.1)
Total Bilirubin: 0.5 mg/dL (ref 0.3–1.2)

## 2013-03-19 LAB — PRO B NATRIURETIC PEPTIDE: Pro B Natriuretic peptide (BNP): 427.8 pg/mL — ABNORMAL HIGH (ref 0–125)

## 2013-03-19 LAB — BLOOD GAS, ARTERIAL
Acid-Base Excess: 1.9 mmol/L (ref 0.0–2.0)
Drawn by: 331471
O2 Saturation: 93.6 %
TCO2: 22.7 mmol/L (ref 0–100)
pCO2 arterial: 39.6 mmHg (ref 35.0–45.0)
pO2, Arterial: 68.9 mmHg — ABNORMAL LOW (ref 80.0–100.0)

## 2013-03-19 LAB — URINALYSIS, ROUTINE W REFLEX MICROSCOPIC
Glucose, UA: NEGATIVE mg/dL
Leukocytes, UA: NEGATIVE
Nitrite: NEGATIVE
Specific Gravity, Urine: 1.024 (ref 1.005–1.030)
pH: 6 (ref 5.0–8.0)

## 2013-03-19 MED ORDER — ONDANSETRON HCL 4 MG/2ML IJ SOLN: 4.0000 mg | Freq: Four times a day (QID) | INTRAMUSCULAR | Status: DC | PRN

## 2013-03-19 MED ORDER — FERROUS SULFATE 325 (65 FE) MG PO TABS
325.0000 mg | ORAL_TABLET | Freq: Two times a day (BID) | ORAL | Status: DC
  Administered 2013-03-19 – 2013-03-20 (×2): 325 mg via ORAL
  Filled 2013-03-19 (×3): qty 1

## 2013-03-19 MED ORDER — HEPARIN SODIUM (PORCINE) 5000 UNIT/ML IJ SOLN
5000.0000 [IU] | Freq: Three times a day (TID) | INTRAMUSCULAR | Status: DC
  Administered 2013-03-19 – 2013-03-20 (×3): 5000 [IU] via SUBCUTANEOUS
  Filled 2013-03-19 (×5): qty 1

## 2013-03-19 MED ORDER — SODIUM CHLORIDE 0.9 % IJ SOLN
3.0000 mL | Freq: Two times a day (BID) | INTRAMUSCULAR | Status: DC
  Administered 2013-03-19 – 2013-03-20 (×2): 3 mL via INTRAVENOUS

## 2013-03-19 MED ORDER — PSYLLIUM 0.52 G PO CAPS: 0.5200 g | ORAL_CAPSULE | Freq: Every morning | ORAL | Status: DC

## 2013-03-19 MED ORDER — ONDANSETRON HCL 4 MG PO TABS: 4.0000 mg | ORAL_TABLET | Freq: Four times a day (QID) | ORAL | Status: DC | PRN

## 2013-03-19 MED ORDER — CARBIDOPA-LEVODOPA 25-100 MG PO TABS
1.0000 | ORAL_TABLET | Freq: Four times a day (QID) | ORAL | Status: DC
  Administered 2013-03-19 – 2013-03-20 (×3): 1 via ORAL
  Filled 2013-03-19 (×6): qty 1

## 2013-03-19 MED ORDER — FENOFIBRATE 54 MG PO TABS
54.0000 mg | ORAL_TABLET | Freq: Every day | ORAL | Status: DC
  Administered 2013-03-20: 54 mg via ORAL
  Filled 2013-03-19: qty 1

## 2013-03-19 MED ORDER — DONEPEZIL HCL 5 MG PO TABS
5.0000 mg | ORAL_TABLET | Freq: Every day | ORAL | Status: DC
  Administered 2013-03-19: 5 mg via ORAL
  Filled 2013-03-19 (×2): qty 1

## 2013-03-19 MED ORDER — HYDROCHLOROTHIAZIDE 25 MG PO TABS
25.0000 mg | ORAL_TABLET | Freq: Every morning | ORAL | Status: DC
  Administered 2013-03-20: 25 mg via ORAL
  Filled 2013-03-19: qty 1

## 2013-03-19 MED ORDER — VITAMIN D3 25 MCG (1000 UNIT) PO TABS
1000.0000 [IU] | ORAL_TABLET | Freq: Every morning | ORAL | Status: DC
Start: 2013-03-20 — End: 2013-03-20
  Administered 2013-03-20: 1000 [IU] via ORAL
  Filled 2013-03-19: qty 1

## 2013-03-19 MED ORDER — ACETAMINOPHEN 325 MG PO TABS: 650.0000 mg | ORAL_TABLET | Freq: Four times a day (QID) | ORAL | Status: DC | PRN

## 2013-03-19 MED ORDER — ACETAMINOPHEN 650 MG RE SUPP: 650.0000 mg | Freq: Four times a day (QID) | RECTAL | Status: DC | PRN

## 2013-03-19 MED ORDER — ESCITALOPRAM OXALATE 20 MG PO TABS
20.0000 mg | ORAL_TABLET | Freq: Every morning | ORAL | Status: DC
  Administered 2013-03-20: 20 mg via ORAL
  Filled 2013-03-19: qty 1

## 2013-03-19 MED ORDER — QUETIAPINE FUMARATE 50 MG PO TABS
50.0000 mg | ORAL_TABLET | Freq: Every day | ORAL | Status: DC
Start: 2013-03-20 — End: 2013-03-20
  Filled 2013-03-19: qty 1

## 2013-03-19 MED ORDER — MICONAZOLE NITRATE 2 % EX CREA: 1.0000 "application " | TOPICAL_CREAM | Freq: Two times a day (BID) | CUTANEOUS | Status: DC | PRN

## 2013-03-19 MED ORDER — DOCUSATE SODIUM 100 MG PO CAPS
100.0000 mg | ORAL_CAPSULE | Freq: Every morning | ORAL | Status: DC
  Administered 2013-03-20: 100 mg via ORAL
  Filled 2013-03-19: qty 1

## 2013-03-19 MED ORDER — PSYLLIUM 95 % PO PACK
1.0000 | PACK | Freq: Every day | ORAL | Status: DC
  Administered 2013-03-20: 1 via ORAL
  Filled 2013-03-19: qty 1

## 2013-03-19 NOTE — H&P (Signed)
[facility] History and Physical  Joseph [name] NWG:[id number] DOB: Sep 26, 1940 DOA: 03/19/2013  Referring physician: Dr. Blinda Leatherwood PCP: Kimber Relic, MD  Specialists: none  Chief Complaint: decrease in po intake and difficult to arouse  HPI: Joseph [name] is a 72 y.o. male  With history of dementia and parkinson's disease.  Presented with the above complaints x 1 day. As such was brought to ED for further evaluation and recommendations.  The history is mainly obtained from the son and the ED physician.  The patient does not interact with examiner and is resting in bed.  Reportedly patient has had these periods of altered mental status but what worried son was the reported decrease in oral intake.  In the ED patient had negative work up with CT of head reportedly negative for acute intracranial abnormalities. Chest x ray was negative for infectious source as well as urinalysis and WBC within normal limits.  We have been consulted for further evaluation and recommendations regarding AMS.  Review of Systems: Unable to properly assess due to dementia  Past Medical History  Diagnosis Date  . Parkinson disease   . Seizures   . Depression   . Hypertension   . Dementia   . UTI (lower urinary tract infection)    Past Surgical History  Procedure Laterality Date  . Hip fracture surgery     Social History:  reports that he quit smoking about 50 years ago. He has never used smokeless tobacco. He reports that  drinks alcohol. He reports that he does not use illicit drugs.  where does patient live--home, ALF, SNF? and with whom if at home? Pt lives in memory unit  Can patient participate in ADLs? Not currently  No Known Allergies  History reviewed. No pertinent family history. Pancreatic cancer (his mother)  Prior to Admission medications   Medication Sig Start Date End Date Taking? Authorizing Provider  carbidopa-levodopa (SINEMET IR) 25-100 MG per tablet Take 1 tablet by mouth 4 (four)  times daily. 05/26/12  Yes Renee A Kuneff, DO  cholecalciferol (VITAMIN D) 1000 UNITS tablet Take 1,000 Units by mouth every morning.    Yes Historical Provider, MD  docusate sodium (COLACE) 100 MG capsule Take 100 mg by mouth every morning.    Yes Historical Provider, MD  donepezil (ARICEPT) 5 MG tablet Take 1 tablet (5 mg total) by mouth at bedtime. 05/26/12  Yes Renee A Kuneff, DO  escitalopram (LEXAPRO) 20 MG tablet Take 20 mg by mouth every morning.    Yes Historical Provider, MD  fenofibrate (TRICOR) 48 MG tablet Take 48 mg by mouth every morning.   Yes Historical Provider, MD  ferrous sulfate 325 (65 FE) MG tablet Take 325 mg by mouth 2 (two) times daily.     Yes Historical Provider, MD  fluocinonide ointment (LIDEX) 0.05 % Apply 1 application topically every evening. Apply to rash on back   Yes Historical Provider, MD  hydrochlorothiazide (HYDRODIURIL) 25 MG tablet Take 25 mg by mouth every morning.   Yes Historical Provider, MD  miconazole (MICOTIN) 2 % cream Apply 1 application topically as needed (burning). Apply to buttocks.   Yes Historical Provider, MD  Multiple Vitamins-Minerals (HCA SUPER THERAVITE-M PO) Take 1 tablet by mouth every morning.   Yes Historical Provider, MD  nystatin cream (MYCOSTATIN) Apply 1 application topically 2 (two) times daily. Apply to genital area   Yes Historical Provider, MD  potassium chloride SA (K-DUR,KLOR-CON) 20 MEQ tablet Take 20 mEq by mouth every morning.  Yes Historical Provider, MD  psyllium (REGULOID) 0.52 G capsule Take 0.52 g by mouth every morning.    Yes Historical Provider, MD  QUEtiapine (SEROQUEL) 50 MG tablet Take 50 mg by mouth every morning.    Yes Historical Provider, MD   Physical Exam: Filed Vitals:   03/19/13 1327 03/19/13 1340  BP:  124/52  Pulse:  86  Temp:  98.6 F (37 C)  TempSrc:  Oral  Resp:  16  SpO2: 98% 94%     General:  Pt in NAD, somnolent but arrousable  Eyes: Patient hold eyes shut and does not allow for  proper evaluation  ENT: normal exterior appearance, dry mucous membranes  Neck: supple, no goiter  Cardiovascular: RRR, no MRG  Respiratory: CTA BL, no wheezes  Abdomen: soft, NT, ND  Skin: warm and dry  Musculoskeletal: no cyanosis or clubbing  Psychiatric: unable to assess due to dementia  Neurologic: unable to properly assess due to dementia and limited patient interaction with examiner.  Labs on Admission:  Basic Metabolic Panel:  Recent Labs Lab 03/19/13 1400  NA 139  K 3.7  CL 103  CO2 27  GLUCOSE 95  BUN 18  CREATININE 0.86  CALCIUM 9.3   Liver Function Tests:  Recent Labs Lab 03/19/13 1400  AST 17  ALT 6  ALKPHOS 50  BILITOT 0.5  PROT 6.8  ALBUMIN 3.4*   No results found for this basename: LIPASE, AMYLASE,  in the last 168 hours No results found for this basename: AMMONIA,  in the last 168 hours CBC:  Recent Labs Lab 03/19/13 1400  WBC 7.8  NEUTROABS 5.4  HGB 13.9  HCT 40.3  MCV 88.8  PLT 219   Cardiac Enzymes: No results found for this basename: CKTOTAL, CKMB, CKMBINDEX, TROPONINI,  in the last 168 hours  BNP (last 3 results)  Recent Labs  05/23/12 0700 03/19/13 1400  PROBNP 2460.0* 427.8*   CBG: No results found for this basename: GLUCAP,  in the last 168 hours  Radiological Exams on Admission: Ct Head Wo Contrast  03/19/2013   *RADIOLOGY REPORT*  Clinical Data: Altered mental status, weakness  CT HEAD WITHOUT CONTRAST  Technique:  Contiguous axial images were obtained from the base of the skull through the vertex without contrast.  Comparison: Prior head CT 05/22/2012  Findings: No acute intracranial hemorrhage, acute infarction, mass lesion, mass effect, midline shift or hydrocephalus.  Gray-white differentiation is preserved throughout.  Similar pattern of the cerebral and cerebellar atrophy compared to prior and commensurate with age related involutional changes.  Minimal periventricular white matter hypoattenuation.  No focal  soft tissue abnormality. Normal aeration of the mastoid air cells and the majority the visualized paranasal sinuses.  There is a small mucous retention cyst versus polyp along the anterior wall of the left maxillary sinus.  IMPRESSION:  1.  No acute intracranial abnormality. 2.  Stable cerebral and cerebellar volume loss.   Original Report Authenticated By: Malachy Moan, M.D.   Dg Chest Port 1 View  03/19/2013   *RADIOLOGY REPORT*  Clinical Data: Mental status changes.  PORTABLE CHEST - 1 VIEW  Comparison: 12/13/2012.  Findings: Chronic cardiomegaly.  There is mild pulmonary edema with cephalized pulmonary blood flow, fissural thickening, and peribronchial cuffing. Indistinct opacity at the medial left base which may be technical.  No large pleural effusion.  No pneumothorax.  Mild dextroscoliotic curvature of the upper thoracic spine.  IMPRESSION: Cardiomegaly with mild pulmonary edema.   Original Report Authenticated By:  Tiburcio Pea    Assessment/Plan Active Problems:  1. Metabolic encephalopathy - Will order routine labs: folate, vitamin b 12, rpr, hiv - ABG shows no CO2 narcosis - CT of head negative - could be 2ary to medications (Seroquil) patient does have history of hallucinations and I suspect this is why he is on this medication.  Will change Seroquil to QHS.  Seroquil is associated with somnolence and if further lab work negative would consider this as contributing factor  2. Parkinsons disease - Continue home regimen  3. Dementia  - May be contributing to # 1 - will continue home regimen  4. HTN - stable, will continue home regimen  5. DVT prophylaxis - heparin SQ   Code Status: full (no feeding tube) Family Communication: Discussed with son Disposition Plan: Pending improvement in mental status back to baseline  Time spent: > 60 minutes  Penny Pia [facility] Pager [phone]  If 7PM-7AM, please contact night-coverage [email or web address] Password  [email or web address] 03/19/2013, 5:07 PM

## 2013-03-19 NOTE — Progress Notes (Signed)
CSW met with patient and patient son at bedside. Pt soundlysleeping. Pt son confirmed pt is a resident at [facility] and plans to return when medically stable.   Catha Gosselin, LCSWA  [phone] .03/19/2013 1541pm

## 2013-03-19 NOTE — ED Provider Notes (Signed)
History    CSN: [id number] Arrival date & time 03/19/13  1327  First MD Initiated Contact with Patient 03/19/13 1416     Chief Complaint  Patient presents with  . Urinary Tract Infection   (Consider location/radiation/quality/duration/timing/severity/associated sxs/prior Treatment) HPI Comments: Patient brought to the ER for evaluation of mental status changes. Patient currently resides in an assisted living unit for memory loss. He was sent to the ER because he has been less responsive than usual. He also reports he has not been eating or drinking. Symptoms have progressed over a period of one day. At arrival to the ER, patient is very sleepy, difficult to awaken. Does not answer questions appropriately. Level V caveat applies.  Patient is a 72 y.o. male presenting with urinary tract infection.  Urinary Tract Infection   Past Medical History  Diagnosis Date  . Parkinson disease   . Seizures   . Depression   . Hypertension   . Dementia   . UTI (lower urinary tract infection)    Past Surgical History  Procedure Laterality Date  . Hip fracture surgery     No family history on file. History  Substance Use Topics  . Smoking status: Never Smoker   . Smokeless tobacco: Not on file  . Alcohol Use: Yes    Review of Systems  Unable to perform ROS: Dementia    Allergies  Review of patient's allergies indicates no known allergies.  Home Medications   Current Outpatient Rx  Name  Route  Sig  Dispense  Refill  . carbidopa-levodopa (SINEMET IR) 25-100 MG per tablet   Oral   Take 1 tablet by mouth 4 (four) times daily.   120 tablet   0   . cholecalciferol (VITAMIN D) 1000 UNITS tablet   Oral   Take 1,000 Units by mouth every morning.          . docusate sodium (COLACE) 100 MG capsule   Oral   Take 100 mg by mouth every morning.          . donepezil (ARICEPT) 5 MG tablet   Oral   Take 1 tablet (5 mg total) by mouth at bedtime.   30 tablet   0   .  escitalopram (LEXAPRO) 20 MG tablet   Oral   Take 20 mg by mouth every morning.          . fenofibrate (TRICOR) 48 MG tablet   Oral   Take 48 mg by mouth every morning.         . ferrous sulfate 325 (65 FE) MG tablet   Oral   Take 325 mg by mouth 2 (two) times daily.           . fluocinonide ointment (LIDEX) 0.05 %   Topical   Apply 1 application topically every evening. Apply to rash on back         . hydrochlorothiazide (HYDRODIURIL) 25 MG tablet   Oral   Take 25 mg by mouth every morning.         . miconazole (MICOTIN) 2 % cream   Topical   Apply 1 application topically as needed (burning). Apply to buttocks.         . Multiple Vitamins-Minerals (HCA SUPER THERAVITE-M PO)   Oral   Take 1 tablet by mouth every morning.         . nystatin cream (MYCOSTATIN)   Topical   Apply 1 application topically 2 (two) times daily. Apply to  genital area         . potassium chloride SA (K-DUR,KLOR-CON) 20 MEQ tablet   Oral   Take 20 mEq by mouth every morning.          . psyllium (REGULOID) 0.52 G capsule   Oral   Take 0.52 g by mouth every morning.          Marland Kitchen QUEtiapine (SEROQUEL) 50 MG tablet   Oral   Take 50 mg by mouth every morning.           BP 124/52  Pulse 86  Temp(Src) 98.6 F (37 C) (Oral)  Resp 16  SpO2 94% Physical Exam  Constitutional: He appears well-developed and well-nourished. He appears lethargic. No distress.  HENT:  Head: Normocephalic and atraumatic.  Right Ear: Hearing normal.  Left Ear: Hearing normal.  Nose: Nose normal.  Mouth/Throat: Oropharynx is clear and moist and mucous membranes are normal.  Eyes: Conjunctivae and EOM are normal. Pupils are equal, round, and reactive to light.  Neck: Normal range of motion. Neck supple.  Cardiovascular: Regular rhythm, S1 normal and S2 normal.  Exam reveals no gallop and no friction rub.   No murmur heard. Pulmonary/Chest: Effort normal and breath sounds normal. No respiratory  distress. He exhibits no tenderness.  Abdominal: Soft. Normal appearance and bowel sounds are normal. There is no hepatosplenomegaly. There is no tenderness. There is no rebound, no guarding, no tenderness at McBurney's point and negative Murphy's sign. No hernia.  Musculoskeletal: Normal range of motion.  Neurological: He has normal strength. He appears lethargic. No cranial nerve deficit or sensory deficit. Coordination normal. GCS eye subscore is 3. GCS verbal subscore is 4. GCS motor subscore is 6.  Skin: Skin is warm, dry and intact. No rash noted. No cyanosis.  Psychiatric: He has a normal mood and affect. His speech is normal and behavior is normal. Thought content normal.    ED Course  Procedures (including critical care time) Labs Reviewed  COMPREHENSIVE METABOLIC PANEL - Abnormal; Notable for the following:    Albumin 3.4 (*)    GFR calc non Af Amer 84 (*)    All other components within normal limits  URINALYSIS, ROUTINE W REFLEX MICROSCOPIC  CBC WITH DIFFERENTIAL   Ct Head Wo Contrast  03/19/2013   *RADIOLOGY REPORT*  Clinical Data: Altered mental status, weakness  CT HEAD WITHOUT CONTRAST  Technique:  Contiguous axial images were obtained from the base of the skull through the vertex without contrast.  Comparison: Prior head CT 05/22/2012  Findings: No acute intracranial hemorrhage, acute infarction, mass lesion, mass effect, midline shift or hydrocephalus.  Gray-white differentiation is preserved throughout.  Similar pattern of the cerebral and cerebellar atrophy compared to prior and commensurate with age related involutional changes.  Minimal periventricular white matter hypoattenuation.  No focal soft tissue abnormality. Normal aeration of the mastoid air cells and the majority the visualized paranasal sinuses.  There is a small mucous retention cyst versus polyp along the anterior wall of the left maxillary sinus.  IMPRESSION:  1.  No acute intracranial abnormality. 2.  Stable  cerebral and cerebellar volume loss.   Original Report Authenticated By: Malachy Moan, M.D.   Dg Chest Port 1 View  03/19/2013   *RADIOLOGY REPORT*  Clinical Data: Mental status changes.  PORTABLE CHEST - 1 VIEW  Comparison: 12/13/2012.  Findings: Chronic cardiomegaly.  There is mild pulmonary edema with cephalized pulmonary blood flow, fissural thickening, and peribronchial cuffing. Indistinct opacity at the medial  left base which may be technical.  No large pleural effusion.  No pneumothorax.  Mild dextroscoliotic curvature of the upper thoracic spine.  IMPRESSION: Cardiomegaly with mild pulmonary edema.   Original Report Authenticated By: Tiburcio Pea   Mental status changes  MDM  Patient brought to the ER for evaluation of confusion and decreased responsiveness. Patient does have a history of dementia as well as Parkinson's. Son reports that he has good days and bad days, at times is very confused. My examination here, reveals, however that the patient is somewhat difficult to arouse. Has not had any obvious cause for the patient's medical condition at this time. CT head shows no abnormality. There is no evidence of urinary tract infection or other infection. Patient does have some interstitial prominence on chest x-ray, but is not in any respiratory distress. Patient is not alert enough to be returned back to his assisted living environment. Will require hospitalization.  Gilda Crease, MD 03/19/13 1530

## 2013-03-19 NOTE — ED Notes (Signed)
Patient transported to CT 

## 2013-03-19 NOTE — Progress Notes (Signed)
Patient has dementia and is from a memory care unit.  Son who is HCPOA declined activation of MyChart

## 2013-03-19 NOTE — ED Notes (Signed)
[id number]<AV> Expected date:<BR> Expected time:<BR> Means of arrival:<BR> Comments:<BR> UTI

## 2013-03-19 NOTE — Progress Notes (Signed)
Clinical Social Work Department BRIEF PSYCHOSOCIAL ASSESSMENT 03/19/2013  Patient:  Joseph [name], Joseph [name]     Account Number:  [id number]     Admit date:  03/19/2013  Clinical Social Worker:  Doree Albee  Date/Time:  03/19/2013 06:00 PM  Referred by:  CSW  Date Referred:  03/19/2013 Referred for  ALF Placement   Other Referral:   Interview type:  Family Other interview type:   pt son, Joseph [name]    PSYCHOSOCIAL DATA Living Status:  FACILITY Admitted from facility:  [facility] Level of care:  Assisted Living Primary support name:  Joseph [name] Primary support relationship to patient:  CHILD, ADULT Degree of support available:   strong    CURRENT CONCERNS Current Concerns  Post-Acute Placement   Other Concerns:    SOCIAL WORK ASSESSMENT / PLAN CSW met with pt and pt son at bedside. Pt was soundly sleeping. pt son shared that patient is a resident at [facility] and plans to return when medically stabl.e   Assessment/plan status:  Psychosocial Support/Ongoing Assessment of Needs Other assessment/ plan:   Information/referral to community resources:   none identified    PATIENT'S/FAMILY'S RESPONSE TO PLAN OF CARE: Pt son plans for pt to return to [facility] when medically staeble. CSW will inform unit csw of pt disposition needs. Pt son thanked csw for concern and support.     Catha Gosselin, LCSWA  [phone] .03/19/2013 1802pm

## 2013-03-19 NOTE — ED Notes (Signed)
PER EMS- pt picked up from [facility] with c/o possible UTI.  Reports pt has been increasingly weak x1 day.  Pt is alert and oriented per baseline.

## 2013-03-20 DIAGNOSIS — F0390 Unspecified dementia without behavioral disturbance: Secondary | ICD-10-CM

## 2013-03-20 DIAGNOSIS — G9340 Encephalopathy, unspecified: Secondary | ICD-10-CM

## 2013-03-20 LAB — CBC
MCH: 29.8 pg (ref 26.0–34.0)
MCHC: 33.7 g/dL (ref 30.0–36.0)
MCV: 88.6 fL (ref 78.0–100.0)
Platelets: 203 10*3/uL (ref 150–400)
RDW: 13 % (ref 11.5–15.5)

## 2013-03-20 LAB — HIV ANTIBODY (ROUTINE TESTING W REFLEX): HIV: NONREACTIVE

## 2013-03-20 LAB — TSH: TSH: 1.669 u[IU]/mL (ref 0.350–4.500)

## 2013-03-20 LAB — BASIC METABOLIC PANEL
BUN: 16 mg/dL (ref 6–23)
CO2: 28 mEq/L (ref 19–32)
Calcium: 9.2 mg/dL (ref 8.4–10.5)
Creatinine, Ser: 0.84 mg/dL (ref 0.50–1.35)
GFR calc non Af Amer: 85 mL/min — ABNORMAL LOW (ref >90)
Glucose, Bld: 101 mg/dL — ABNORMAL HIGH (ref 70–99)

## 2013-03-20 LAB — MRSA PCR SCREENING: MRSA by PCR: POSITIVE — AB

## 2013-03-20 LAB — VITAMIN B12: Vitamin B-12: 752 pg/mL (ref 211–911)

## 2013-03-20 MED ORDER — QUETIAPINE FUMARATE 50 MG PO TABS: 50.0000 mg | ORAL_TABLET | Freq: Every day | ORAL | Status: DC

## 2013-03-20 MED ORDER — BACITRACIN ZINC 500 UNIT/GM EX OINT: TOPICAL_OINTMENT | Freq: Two times a day (BID) | CUTANEOUS | Status: DC

## 2013-03-20 MED ORDER — MUPIROCIN 2 % EX OINT
1.0000 "application " | TOPICAL_OINTMENT | Freq: Two times a day (BID) | CUTANEOUS | Status: DC
  Administered 2013-03-20: 1 via NASAL
  Filled 2013-03-20: qty 22

## 2013-03-20 MED ORDER — CHLORHEXIDINE GLUCONATE CLOTH 2 % EX PADS
6.0000 | MEDICATED_PAD | Freq: Every day | CUTANEOUS | Status: DC
Start: 2013-03-20 — End: 2013-03-20

## 2013-03-20 MED ORDER — CHLORHEXIDINE GLUCONATE CLOTH 2 % EX PADS: 6.0000 | MEDICATED_PAD | Freq: Every day | CUTANEOUS | Status: DC

## 2013-03-20 NOTE — Progress Notes (Signed)
Report given to Med tech at [facility]- Kym Groom.Hulda Marin RN

## 2013-03-20 NOTE — Progress Notes (Signed)
Nutrition Brief Note  Patient identified due to Low Braden   Body mass index is 27.73 kg/(m^2). Patient meets criteria for Overweight based on current BMI.   Current diet order is Regular, patient is consuming approximately 100% of meals at this time. Per multidisciplinary rounds pt is much improved today and eating well. Pt reports that he usually weighs 200 lbs but, has gained weight and has been told that he needs to lose weight. Pt states his appetite is good and he was eating 3 good meals daily PTA. Pt denies any difficulty chewing or swallowing. RD sat with pt as he ate crackers; pt ate slowly without any problems. Labs and medications reviewed.   No nutrition interventions warranted at this time. If nutrition issues arise, please consult RD.   Ian Malkin RD, LDN Inpatient Clinical Dietitian Pager: [phone] After Hours Pager: [phone]

## 2013-03-20 NOTE — Progress Notes (Signed)
MRSA PCR SWAB POSITIVE, PLACED ON PRECAUTIONS, [facility] FLOOR COVERAGE MD NOTIFIED, Griselda Tosh, RN

## 2013-03-20 NOTE — Discharge Summary (Signed)
Physician Discharge Summary  Zohair Epp ZOX:[id number] DOB: [birth year] DOA: 03/19/2013  PCP: Kimber Relic, MD  Admit date: 03/19/2013 Discharge date: 03/20/2013  Time spent: Less than 30 minutes  Recommendations for Outpatient Follow-up:  1. Dr. Murray Hodgkins, PCP in 3 days  Discharge Diagnoses:  Principal Problem:   Metabolic encephalopathy Active Problems:   Parkinson disease, symptomatic   Hypertension   Dementia in Parkinson's disease   Dementia with behavioral disturbance   Discharge Condition: Improved & Stable  Diet recommendation: Heart healthy diet  Filed Weights   03/20/13 0237  Weight: 103.3 kg (227 lb 11.8 oz)    History of present illness:  72 year old male [name], resident of assisted-living facility, with past medical history of Parkinson's disease, dementia, HTN, depression, seizures (does not seem to be on medications), UTI was transferred to the [facility] ED on 03/19/13 with a one-day history of difficulty to arouse him and poor oral intake. [name]'s son provided history in the ED.  He was admitted overnight for further evaluation and management.  Hospital Course:  1. Acute encephalopathy: Possibly related to medications/Seroquel. Workup in the hospital was negative. Lab work did not show any metabolic abnormality. CT head negative. ABG did not show CO2 narcosis. Chest x-ray and UA were negative for features of infection. His altered mental status/somnolence was attributed to Seroquel. He did not get a dose last night. This morning, [name] is alert and oriented and as per his son [name], he is almost back to his baseline. Recommend changing his Seroquel dose to at bedtime and monitoring closely. If he continues to become drowsy again, consider reducing the dose of Seroquel and further evaluation as deemed necessary.. 2. Parkinson's disease: Continue home dose of Sinemet. 3. Dementia: Probably mild to moderate. Continue Aricept. 4. Hypertension:  Controlled. Continue HCTZ. 5. Depression: Continue home medications. Stable.  Procedures:  None   Consultations:  None  Discharge Exam:  Complaints: [name] complains of feeling hungry. Chronic (approximately 9 years) right shoulder pain and limitation of movements. Denies any other complaints.  Filed Vitals:   03/19/13 1809 03/19/13 2100 03/20/13 0237 03/20/13 0700  BP: 140/77 129/72  147/67  Pulse: 57 57  63  Temp: 98.4 F (36.9 C) 98.2 F (36.8 C)  98.1 F (36.7 C)  TempSrc: Oral Oral  Oral  Resp: 16 20  20   Weight:   103.3 kg (227 lb 11.8 oz)   SpO2: 97% 98%  97%     General exam: Comfortable. Sitting up in bed. Pleasant.  Respiratory system: Clear. No increased work of breathing.  Cardiovascular system: S1 and S2 heard, RRR. No JVD, murmurs or pedal edema.  Gastrointestinal system: Abdomen is nondistended, soft and nontender. Normal bowel sounds heard.  Central nervous system: Alert and oriented x3. No focal neurological deficits.  Extremities: Symmetric 5 x 5 power. [name] has multiple healing superficial abrasions on bilateral lower extremities and inner thighs (attributes to spilling hot coffee one week ago). These lesions do not look infected. Parkinsonian tremors of upper extremities.  Psychiatry: Flat affect. Pleasant and cooperative.  Discharge Instructions      Discharge Orders   Future Orders Complete By Expires     Call MD for:  As directed     Comments:      Altered Mental Status.    Diet - low sodium heart healthy  As directed     Increase activity slowly  As directed         Medication List  carbidopa-levodopa 25-100 MG per tablet  Commonly known as:  SINEMET IR  Take 1 tablet by mouth 4 (four) times daily.     cholecalciferol 1000 UNITS tablet  Commonly known as:  VITAMIN D  Take 1,000 Units by mouth every morning.     docusate sodium 100 MG capsule  Commonly known as:  COLACE  Take 100 mg by mouth every morning.      donepezil 5 MG tablet  Commonly known as:  ARICEPT  Take 1 tablet (5 mg total) by mouth at bedtime.     escitalopram 20 MG tablet  Commonly known as:  LEXAPRO  Take 20 mg by mouth every morning.     fenofibrate 48 MG tablet  Commonly known as:  TRICOR  Take 48 mg by mouth every morning.     ferrous sulfate 325 (65 FE) MG tablet  Take 325 mg by mouth 2 (two) times daily.     fluocinonide ointment 0.05 %  Commonly known as:  LIDEX  Apply 1 application topically every evening. Apply to rash on back     HCA SUPER THERAVITE-M PO  Take 1 tablet by mouth every morning.     hydrochlorothiazide 25 MG tablet  Commonly known as:  HYDRODIURIL  Take 25 mg by mouth every morning.     miconazole 2 % cream  Commonly known as:  MICOTIN  Apply 1 application topically as needed (burning). Apply to buttocks.     nystatin cream  Commonly known as:  MYCOSTATIN  Apply 1 application topically 2 (two) times daily. Apply to genital area     potassium chloride SA 20 MEQ tablet  Commonly known as:  K-DUR,KLOR-CON  Take 20 mEq by mouth every morning.     psyllium 0.52 G capsule  Commonly known as:  REGULOID  Take 0.52 g by mouth every morning.     QUEtiapine 50 MG tablet  Commonly known as:  SEROQUEL  Take 1 tablet (50 mg total) by mouth at bedtime.          The results of significant diagnostics from this hospitalization (including imaging, microbiology, ancillary and laboratory) are listed below for reference.    Significant Diagnostic Studies: Ct Head Wo Contrast  03/19/2013   *RADIOLOGY REPORT*  Clinical Data: Altered mental status, weakness  CT HEAD WITHOUT CONTRAST  Technique:  Contiguous axial images were obtained from the base of the skull through the vertex without contrast.  Comparison: Prior head CT 05/22/2012  Findings: No acute intracranial hemorrhage, acute infarction, mass lesion, mass effect, midline shift or hydrocephalus.  Gray-white differentiation is preserved  throughout.  Similar pattern of the cerebral and cerebellar atrophy compared to prior and commensurate with age related involutional changes.  Minimal periventricular white matter hypoattenuation.  No focal soft tissue abnormality. Normal aeration of the mastoid air cells and the majority the visualized paranasal sinuses.  There is a small mucous retention cyst versus polyp along the anterior wall of the left maxillary sinus.  IMPRESSION:  1.  No acute intracranial abnormality. 2.  Stable cerebral and cerebellar volume loss.   Original Report Authenticated By: Malachy Moan, M.D.   Dg Chest Port 1 View  03/19/2013   *RADIOLOGY REPORT*  Clinical Data: Mental status changes.  PORTABLE CHEST - 1 VIEW  Comparison: 12/13/2012.  Findings: Chronic cardiomegaly.  There is mild pulmonary edema with cephalized pulmonary blood flow, fissural thickening, and peribronchial cuffing. Indistinct opacity at the medial left base which may be technical.  No large  pleural effusion.  No pneumothorax.  Mild dextroscoliotic curvature of the upper thoracic spine.  IMPRESSION: Cardiomegaly with mild pulmonary edema.   Original Report Authenticated By: Tiburcio Pea    Microbiology: Recent Results (from the past 240 hour(s))  MRSA PCR SCREENING     Status: Abnormal   Collection Time    03/20/13  1:20 AM      Result Value Range Status   MRSA by PCR POSITIVE (*) NEGATIVE Final   Comment:            The GeneXpert MRSA Assay (FDA     approved for NASAL specimens     only), is one component of a     comprehensive MRSA colonization     surveillance program. It is not     intended to diagnose MRSA     infection nor to guide or     monitor treatment for     MRSA infections.     RESULT CALLED TO, READ BACK BY AND VERIFIED WITH:     R YOW AT 0355 ON 07.11.2014 BY NBROOKS     Labs: Basic Metabolic Panel:  Recent Labs Lab 03/19/13 1400 03/20/13 0335  NA 139 137  K 3.7 3.8  CL 103 102  CO2 27 28  GLUCOSE 95 101*   BUN 18 16  CREATININE 0.86 0.84  CALCIUM 9.3 9.2  MG 2.2  --   PHOS 3.6  --    Liver Function Tests:  Recent Labs Lab 03/19/13 1400  AST 17  ALT 6  ALKPHOS 50  BILITOT 0.5  PROT 6.8  ALBUMIN 3.4*   No results found for this basename: LIPASE, AMYLASE,  in the last 168 hours No results found for this basename: AMMONIA,  in the last 168 hours CBC:  Recent Labs Lab 03/19/13 1400 03/20/13 0335  WBC 7.8 6.8  NEUTROABS 5.4  --   HGB 13.9 12.8*  HCT 40.3 38.0*  MCV 88.8 88.6  PLT 219 203   Cardiac Enzymes: No results found for this basename: CKTOTAL, CKMB, CKMBINDEX, TROPONINI,  in the last 168 hours BNP: BNP (last 3 results)  Recent Labs  05/23/12 0700 03/19/13 1400  PROBNP 2460.0* 427.8*   CBG: No results found for this basename: GLUCAP,  in the last 168 hours  Additional labs:  Folate: >20  Vitamin B12: 752  ABG: PH 7.43, PCO2 39.6, PO2 68.9, bicarbonate 25.8 and oxygen saturation 93.6%  TSH: 1.669  HIV antibody: Nonreactive  RPR: Nonreactive   Signed:  HONGALGI,ANAND  [facility] 03/20/2013, 12:35 PM

## 2013-03-20 NOTE — Progress Notes (Signed)
Clinical Social Work  CSW faxed [employer] and DC summary to [facility]. CSW spoke with Arline Asp and Annice Pih who is agreeable for patient to return today. CSW prepared DC packet and informed patient,son, and RN of DC plans. Patient and son happy with return to ALF. Son prefers [facility] to transport patient back to ALF. [employer] authorization# [id number]. CSW coordinated transportation via [place]. Request # [phone]. . CSW is signing off but available if needed.  Unk Lightning, LCSW (Coverage for [employer])

## 2013-03-20 NOTE — Progress Notes (Signed)
Patient d/c to [facility]F via ambulance,stable,no c/o pain,had good appetite at lunch ate 100%.Alert,oriented to place.- Hulda Marin RN

## 2013-08-17 ENCOUNTER — Emergency Department (HOSPITAL_COMMUNITY)
Admission: EM | Admit: 2013-08-17 | Discharge: 2013-08-17 | Disposition: A | Discharge status: Home / Self Care | Payer: Medicare Other | Attending: Emergency Medicine | Admitting: Emergency Medicine

## 2013-08-17 ENCOUNTER — Emergency Department (HOSPITAL_COMMUNITY): Payer: Medicare Other

## 2013-08-17 ENCOUNTER — Encounter (HOSPITAL_COMMUNITY): Payer: Self-pay | Admitting: Emergency Medicine

## 2013-08-17 DIAGNOSIS — G20A1 Parkinson's disease without dyskinesia, without mention of fluctuations: Secondary | ICD-10-CM | POA: Insufficient documentation

## 2013-08-17 DIAGNOSIS — F329 Major depressive disorder, single episode, unspecified: Secondary | ICD-10-CM | POA: Insufficient documentation

## 2013-08-17 DIAGNOSIS — W06XXXA Fall from bed, initial encounter: Secondary | ICD-10-CM | POA: Insufficient documentation

## 2013-08-17 DIAGNOSIS — T148XXA Other injury of unspecified body region, initial encounter: Secondary | ICD-10-CM | POA: Insufficient documentation

## 2013-08-17 DIAGNOSIS — IMO0002 Reserved for concepts with insufficient information to code with codable children: Secondary | ICD-10-CM | POA: Insufficient documentation

## 2013-08-17 DIAGNOSIS — G20 Parkinson's disease: Secondary | ICD-10-CM | POA: Insufficient documentation

## 2013-08-17 DIAGNOSIS — Y939 Activity, unspecified: Secondary | ICD-10-CM | POA: Insufficient documentation

## 2013-08-17 DIAGNOSIS — Y9210 Unspecified residential institution as the place of occurrence of the external cause: Secondary | ICD-10-CM | POA: Insufficient documentation

## 2013-08-17 DIAGNOSIS — I10 Essential (primary) hypertension: Secondary | ICD-10-CM | POA: Insufficient documentation

## 2013-08-17 DIAGNOSIS — Z79899 Other long term (current) drug therapy: Secondary | ICD-10-CM | POA: Insufficient documentation

## 2013-08-17 DIAGNOSIS — Z87440 Personal history of urinary (tract) infections: Secondary | ICD-10-CM | POA: Insufficient documentation

## 2013-08-17 DIAGNOSIS — F3289 Other specified depressive episodes: Secondary | ICD-10-CM | POA: Insufficient documentation

## 2013-08-17 DIAGNOSIS — F0390 Unspecified dementia without behavioral disturbance: Secondary | ICD-10-CM | POA: Insufficient documentation

## 2013-08-17 DIAGNOSIS — Z87891 Personal history of nicotine dependence: Secondary | ICD-10-CM | POA: Insufficient documentation

## 2013-08-17 LAB — CBC WITH DIFFERENTIAL/PLATELET
Basophils Absolute: 0 10*3/uL (ref 0.0–0.1)
Basophils Relative: 0 % (ref 0–1)
Eosinophils Absolute: 0.2 10*3/uL (ref 0.0–0.7)
Hemoglobin: 12.7 g/dL — ABNORMAL LOW (ref 13.0–17.0)
Lymphocytes Relative: 10 % — ABNORMAL LOW (ref 12–46)
MCH: 29.9 pg (ref 26.0–34.0)
MCHC: 33.8 g/dL (ref 30.0–36.0)
Monocytes Relative: 15 % — ABNORMAL HIGH (ref 3–12)
Neutrophils Relative %: 73 % (ref 43–77)
Platelets: 193 10*3/uL (ref 150–400)
RDW: 12.9 % (ref 11.5–15.5)

## 2013-08-17 LAB — BASIC METABOLIC PANEL
BUN: 23 mg/dL (ref 6–23)
CO2: 25 mEq/L (ref 19–32)
Creatinine, Ser: 0.87 mg/dL (ref 0.50–1.35)
GFR calc Af Amer: >90 mL/min (ref >90)
GFR calc non Af Amer: 84 mL/min — ABNORMAL LOW (ref >90)
Potassium: 3.7 mEq/L (ref 3.5–5.1)

## 2013-08-17 NOTE — ED Provider Notes (Signed)
CSN: [id number]     Arrival date & time 08/17/13  2112 History   First MD Initiated Contact with Patient 08/17/13 2113     Chief Complaint  Patient presents with  . Fall  . Head Injury   (Consider location/radiation/quality/duration/timing/severity/associated sxs/prior Treatment) HPI Comments: Patient brought to the emergency department from skilled nursing facility after a fall. Patient reportedly rolled out of bed and fell onto the ground. Her arrival, he is awake and alert. He appears to be somewhat confused, does have a history of dementia. He denies any pain. Cannot give a full report of what happened due to his dementia. Information provided by EMS who were given information from skilled nursing facility staff. Level V Caveat due to dementia.  Patient is a 72 y.o. male presenting with fall and head injury.  Fall  Head Injury   Past Medical History  Diagnosis Date  . Parkinson disease   . Seizures   . Depression   . Hypertension   . Dementia   . UTI (lower urinary tract infection)    Past Surgical History  Procedure Laterality Date  . Hip fracture surgery     History reviewed. No pertinent family history. History  Substance Use Topics  . Smoking status: Former Smoker    Quit date: 03/20/1963  . Smokeless tobacco: Never Used  . Alcohol Use: Yes    Review of Systems  Unable to perform ROS: Dementia    Allergies  Review of patient's allergies indicates no known allergies.  Home Medications   Current Outpatient Rx  Name  Route  Sig  Dispense  Refill  . baclofen (LIORESAL) 10 MG tablet   Oral   Take 10 mg by mouth 3 (three) times daily. For spasms         . carbidopa-levodopa (SINEMET IR) 25-100 MG per tablet   Oral   Take 1 tablet by mouth 4 (four) times daily.   120 tablet   0   . cholecalciferol (VITAMIN D) 1000 UNITS tablet   Oral   Take 1,000 Units by mouth every morning.          . docusate sodium (COLACE) 100 MG capsule   Oral   Take 100 mg  by mouth every morning.          . donepezil (ARICEPT) 5 MG tablet   Oral   Take 1 tablet (5 mg total) by mouth at bedtime.   30 tablet   0   . escitalopram (LEXAPRO) 20 MG tablet   Oral   Take 20 mg by mouth every morning.          . fenofibrate (TRICOR) 48 MG tablet   Oral   Take 48 mg by mouth every morning.         . ferrous sulfate 325 (65 FE) MG tablet   Oral   Take 325 mg by mouth 2 (two) times daily.           . fluocinonide ointment (LIDEX) 0.05 %   Topical   Apply 1 application topically every evening. Apply to rash on back         . hydrochlorothiazide (HYDRODIURIL) 25 MG tablet   Oral   Take 12.5 mg by mouth every morning.          . Multiple Vitamins-Minerals (HCA SUPER THERAVITE-M PO)   Oral   Take 1 tablet by mouth every morning.         . nystatin cream (MYCOSTATIN)  Topical   Apply 1 application topically 2 (two) times daily. Apply to genital area         . potassium chloride SA (K-DUR,KLOR-CON) 20 MEQ tablet   Oral   Take 20 mEq by mouth every morning.          . psyllium (REGULOID) 0.52 G capsule   Oral   Take 0.52 g by mouth every morning.          Marland Kitchen QUEtiapine (SEROQUEL) 50 MG tablet   Oral   Take 1 tablet (50 mg total) by mouth at bedtime.         . miconazole (MICOTIN) 2 % cream   Topical   Apply 1 application topically as needed (burning). Apply to buttocks.          BP 137/59  Pulse 63  Temp(Src) 98.7 F (37.1 C) (Rectal)  Resp 20  SpO2 95% Physical Exam  Constitutional: He is oriented to person, place, and time. He appears well-developed and well-nourished. No distress.  HENT:  Head: Normocephalic. Head is with abrasion.    Right Ear: Hearing normal.  Left Ear: Hearing normal.  Nose: Nose normal.  Mouth/Throat: Oropharynx is clear and moist and mucous membranes are normal.  Eyes: Conjunctivae and EOM are normal. Pupils are equal, round, and reactive to light.  Neck: Normal range of motion. Neck  supple.  Cardiovascular: Regular rhythm, S1 normal and S2 normal.  Exam reveals no gallop and no friction rub.   No murmur heard. Pulmonary/Chest: Effort normal and breath sounds normal. No respiratory distress. He exhibits no tenderness.  Abdominal: Soft. Normal appearance and bowel sounds are normal. There is no hepatosplenomegaly. There is no tenderness. There is no rebound, no guarding, no tenderness at McBurney's point and negative Murphy's sign. No hernia.  Musculoskeletal: Normal range of motion.       Right hip: He exhibits normal range of motion and no tenderness.       Left hip: He exhibits normal range of motion and no tenderness.       Left knee: He exhibits no swelling and no deformity.       Legs: Neurological: He is alert and oriented to person, place, and time. He has normal strength. No cranial nerve deficit or sensory deficit. Coordination normal. GCS eye subscore is 4. GCS verbal subscore is 5. GCS motor subscore is 6.  Skin: Skin is warm, dry and intact. No rash noted. No cyanosis.  Psychiatric: He has a normal mood and affect. His speech is normal and behavior is normal. Thought content normal.    ED Course  Procedures (including critical care time) Labs Review Labs Reviewed  CBC WITH DIFFERENTIAL - Abnormal; Notable for the following:    Hemoglobin 12.7 (*)    HCT 37.6 (*)    Lymphocytes Relative 10 (*)    Monocytes Relative 15 (*)    Monocytes Absolute 1.2 (*)    All other components within normal limits  BASIC METABOLIC PANEL - Abnormal; Notable for the following:    Glucose, Bld 115 (*)    GFR calc non Af Amer 84 (*)    All other components within normal limits  URINALYSIS, ROUTINE W REFLEX MICROSCOPIC   Imaging Review Dg Thoracic Spine 2 View  08/17/2013   CLINICAL DATA:  Fall, back pain.  EXAM: THORACIC SPINE - 2 VIEW  COMPARISON:  CT of the chest December 13, 2012  FINDINGS: Thoracic vertebral bodies appear intact and aligned, though mild osteopenia decreases  sensitivity. Accentuated thoracic kyphosis, unchanged. Mild disc height loss at all levels, with marginal spurring. No destructive bony lesions. Included prevertebral and paraspinal soft tissue planes are nonsuspicious.  IMPRESSION: No acute fracture deformity or malalignment in the thoracic spine though, osteopenia may decrease sensitivity for acute nondisplaced fractures.   Electronically Signed   By: Awilda Metro   On: 08/17/2013 22:51   Dg Lumbar Spine Complete  08/17/2013   CLINICAL DATA:  Fall, back pain.  EXAM: LUMBAR SPINE - COMPLETE 4+ VIEW  COMPARISON:  None available for comparison at time of study interpretation.  FINDINGS: 5 non rib-bearing lumbar vertebra appear intact and aligned with maintenance of lumbar lordosis. Osteopenia. Mild disk height loss L1-2, L2-3, L4-5 and L5-S1 with marginal spurring. No destructive bony lesions. Left hip pain partially imaged.  Sacroiliac joints are symmetric. Mild vascular calcifications. Soft tissue planes and included osseous structures are nonsuspicious.  IMPRESSION: No acute fracture deformity or malalignment, please note patient is osteopenic which may decrease sensitivity for acute nondisplaced fractures.   Electronically Signed   By: Awilda Metro   On: 08/17/2013 22:53   Dg Hip Bilateral W/pelvis  08/17/2013   CLINICAL DATA:  Fall today.  Bilateral hip pain.  Hips are flexed.  EXAM: BILATERAL HIP WITH PELVIS - 4+ VIEW  COMPARISON:  Lumbar spine complete today  FINDINGS: Positioning is nonstandard. Previous left hip ORIF. No evidence for acute fracture or subluxation. There are degenerative changes in the lower spine. There is significant degenerative change involving the left hip. Heterotopic bone is identified surrounding the plate and left hip. Regional bowel gas pattern is nonobstructive.  IMPRESSION: 1. Nonstandard positioning. 2. No evidence for acute abnormality. 3. Postoperative changes involving the left hip.   Electronically Signed    By: Rosalie Gums M.D.   On: 08/17/2013 22:50   Dg Knee 1-2 Views Left  08/17/2013   CLINICAL DATA:  Fall today. Abrasion of the anterior knee. Leg is fixed in flexed position.  EXAM: LEFT KNEE - 1-2 VIEW  COMPARISON:  None.  FINDINGS: Study is slightly limited by nonstandard patient positioning. There is no evidence for acute fracture or subluxation. No definite joint effusion. Degenerative changes are noted. Bones appear osteopenic.  IMPRESSION: No evidence for acute  abnormality.   Electronically Signed   By: Rosalie Gums M.D.   On: 08/17/2013 22:46   Ct Head Wo Contrast  08/17/2013   CLINICAL DATA:  Status post fall; concern for head or cervical spine injury.  EXAM: CT HEAD WITHOUT CONTRAST  CT CERVICAL SPINE WITHOUT CONTRAST  TECHNIQUE: Multidetector CT imaging of the head and cervical spine was performed following the standard protocol without intravenous contrast. Multiplanar CT image reconstructions of the cervical spine were also generated.  COMPARISON:  CT of the head performed 03/19/2013, and CT of the head and cervical spine performed 08/07/2011  FINDINGS: CT HEAD FINDINGS  There is no evidence of acute infarction, mass lesion, or intra- or extra-axial hemorrhage on CT. Evaluation is markedly suboptimal due to limitations in technique. The patient could not be appropriately positioned due to severe kyphosis, and thus a larger field-of-view was utilized.  Prominence of the ventricles and sulci reflects mild to moderate cortical volume loss.  The brainstem and fourth ventricle are within normal limits. The basal ganglia are unremarkable in appearance. The cerebral hemispheres demonstrate grossly normal gray-white differentiation. No mass effect or midline shift is seen.  There is no evidence of fracture; visualized osseous structures are unremarkable in  appearance. The orbits are within normal limits. The paranasal sinuses and mastoid air cells are well-aerated. No significant soft tissue abnormalities  are seen.  CT CERVICAL SPINE FINDINGS  There is no evidence of fracture or subluxation. Vertebral bodies demonstrate normal height and alignment. Mild multilevel disc space narrowing is noted along the lower cervical spine, with anterior and posterior disc osteophyte complexes seen at C6-C7. Mild degenerative change is noted at the dens. Prevertebral soft tissues are within normal limits.  The visualized portions of the thyroid gland are unremarkable in appearance. The visualized lung apices are clear. Calcification is noted at the carotid bifurcations bilaterally.  IMPRESSION: 1. Markedly suboptimal evaluation of the head, due to limitations in positioning. No evidence of traumatic intracranial injury or fracture. 2. No evidence of fracture or subluxation along the cervical spine. 3. Mild degenerative change at the lower cervical spine. 4. Mild to moderate cortical volume loss noted. 5. Calcification at the carotid bifurcations bilaterally.   Electronically Signed   By: Roanna Raider M.D.   On: 08/17/2013 23:09   Ct Cervical Spine Wo Contrast  08/17/2013   CLINICAL DATA:  Status post fall; concern for head or cervical spine injury.  EXAM: CT HEAD WITHOUT CONTRAST  CT CERVICAL SPINE WITHOUT CONTRAST  TECHNIQUE: Multidetector CT imaging of the head and cervical spine was performed following the standard protocol without intravenous contrast. Multiplanar CT image reconstructions of the cervical spine were also generated.  COMPARISON:  CT of the head performed 03/19/2013, and CT of the head and cervical spine performed 08/07/2011  FINDINGS: CT HEAD FINDINGS  There is no evidence of acute infarction, mass lesion, or intra- or extra-axial hemorrhage on CT. Evaluation is markedly suboptimal due to limitations in technique. The patient could not be appropriately positioned due to severe kyphosis, and thus a larger field-of-view was utilized.  Prominence of the ventricles and sulci reflects mild to moderate cortical  volume loss.  The brainstem and fourth ventricle are within normal limits. The basal ganglia are unremarkable in appearance. The cerebral hemispheres demonstrate grossly normal gray-white differentiation. No mass effect or midline shift is seen.  There is no evidence of fracture; visualized osseous structures are unremarkable in appearance. The orbits are within normal limits. The paranasal sinuses and mastoid air cells are well-aerated. No significant soft tissue abnormalities are seen.  CT CERVICAL SPINE FINDINGS  There is no evidence of fracture or subluxation. Vertebral bodies demonstrate normal height and alignment. Mild multilevel disc space narrowing is noted along the lower cervical spine, with anterior and posterior disc osteophyte complexes seen at C6-C7. Mild degenerative change is noted at the dens. Prevertebral soft tissues are within normal limits.  The visualized portions of the thyroid gland are unremarkable in appearance. The visualized lung apices are clear. Calcification is noted at the carotid bifurcations bilaterally.  IMPRESSION: 1. Markedly suboptimal evaluation of the head, due to limitations in positioning. No evidence of traumatic intracranial injury or fracture. 2. No evidence of fracture or subluxation along the cervical spine. 3. Mild degenerative change at the lower cervical spine. 4. Mild to moderate cortical volume loss noted. 5. Calcification at the carotid bifurcations bilaterally.   Electronically Signed   By: Roanna Raider M.D.   On: 08/17/2013 23:09    EKG Interpretation   None       MDM  Diagnosis: Multiple abrasions  Patient reportedly rolled out of his bed and landed on a carpeted floor. He has superficial abrasions on his forehead and the left  knee. He did not appear to be in any distress at arrival. Evaluation did not reveal any obvious deformity of the extremities or areas of tenderness. Because of his dementia, however, thorough radiologic evaluation was  pursued. No T abnormalities are seen. CT head was limited but I have low suspicion for intracranial injury. The remainder of the x-rays were unremarkable. Patient will be returned to the skilled nursing facility.    Gilda Crease, MD 08/17/13 [phone]

## 2013-08-17 NOTE — ED Notes (Signed)
Discussed patient discharge instructions with patient son. No additional questions. Request [facility] for transport.

## 2013-08-17 NOTE — ED Notes (Signed)
Per ems: The patient rolled out of Bed and fell onto his left side. The patient has carpet on the floor.

## 2013-08-17 NOTE — ED Notes (Signed)
Pt transported to Xray. 

## 2013-08-17 NOTE — ED Notes (Signed)
[facility] called for patient transport 

## 2013-08-17 NOTE — ED Notes (Signed)
Bed: WA09 Expected date: 08/17/13 Expected time: 8:54 PM Means of arrival: Ambulance Comments: 72 yo M  Fall, dementia

## 2013-08-18 LAB — URINALYSIS, ROUTINE W REFLEX MICROSCOPIC
Bilirubin Urine: NEGATIVE
Glucose, UA: NEGATIVE mg/dL
Ketones, ur: NEGATIVE mg/dL
Nitrite: NEGATIVE
Urobilinogen, UA: 1 mg/dL (ref 0.0–1.0)

## 2013-08-18 LAB — URINE MICROSCOPIC-ADD ON

## 2013-08-20 ENCOUNTER — Telehealth: Payer: Self-pay | Admitting: Diagnostic Neuroimaging

## 2013-08-20 LAB — URINE CULTURE

## 2013-08-20 NOTE — Telephone Encounter (Signed)
Gave appt for 3/19 but would like for it to be sooner if possible. Please call. States the facility is saying the patient is getting "stiffer" and they think its due to parkinsons

## 2013-08-20 NOTE — Telephone Encounter (Signed)
Called the patient's facility to schedule an appt for the patient to come in sooner. Made appt. For 08/25/13 at 1:30 with Dr. Marjory Lies, per Tad Moore, CMA Dr.'s assistant. The patient's facilitator Theodoro Grist verbalized understanding.

## 2013-08-21 ENCOUNTER — Telehealth (HOSPITAL_COMMUNITY): Payer: Self-pay

## 2013-08-21 NOTE — ED Notes (Signed)
Post ED Visit - Positive Culture Follow-up  Culture report reviewed by antimicrobial stewardship pharmacist: []  Wes Dulaney, Pharm.D., BCPS [x]  Celedonio Miyamoto, Pharm.D., BCPS []  Georgina Pillion, Pharm.D., BCPS []  [place], [address].D., BCPS, AAHIVP []  Estella Husk, Pharm.D., BCPS, AAHIVP  Positive Urine culture - >/= 100,000 colonies Proteus Mirabilis.  No tx prescribed in ED.  Chart reviewed by L. Parker PA "No Treatment"  Per pharmacy fa results to SN[facility].  Results faxed to [facility] [phone] Attn Shon Baton 08/21/2013, 10:53 AM

## 2013-08-25 ENCOUNTER — Ambulatory Visit (INDEPENDENT_AMBULATORY_CARE_PROVIDER_SITE_OTHER): Payer: Medicare Other | Admitting: Diagnostic Neuroimaging

## 2013-08-25 ENCOUNTER — Encounter: Payer: Self-pay | Admitting: Diagnostic Neuroimaging

## 2013-08-25 VITALS — BP 127/66 | HR 65 | Temp 97.9°F

## 2013-08-25 DIAGNOSIS — G20 Parkinson's disease: Secondary | ICD-10-CM

## 2013-08-25 DIAGNOSIS — F0280 Dementia in other diseases classified elsewhere without behavioral disturbance: Secondary | ICD-10-CM

## 2013-08-25 MED ORDER — CARBIDOPA-LEVODOPA 25-100 MG PO TABS: 1.5000 | ORAL_TABLET | Freq: Three times a day (TID) | ORAL | Status: DC

## 2013-08-25 NOTE — Patient Instructions (Signed)
Change carbidopa/levodopa to 1.5 tabs three times per day (5am, 10am, 4pm).   Consider stopping baclofen if it has not been working.  Physical therapy.

## 2013-08-25 NOTE — Progress Notes (Signed)
[facility]  PATIENT: Joseph [name] DOB: [birth year]  REFERRING CLINICIAN:  HISTORY FROM: patient and son REASON FOR VISIT: follow up   HISTORICAL  CHIEF COMPLAINT:  Chief Complaint  Patient presents with  . Follow-up    PD    HISTORY OF PRESENT ILLNESS:   UPDATE 08/25/13: Since last visit, having more stiffness, esp in legs, esp in early AM when getting out of bed. THis is intermittent, and random. More problems with urinary incontinence. Memory, hallucinations, paranoia, are stable.  UPDATE 05/01/12: Doing well. Better than last time. Behavior and throughts are better. Tremor is better. PT completed.  UPDATE 01/21/12: More problems with staff reporting agitation. Patient thinks that staff is "being rough". An example of conflict includes when the patient is woken up for bed and diaper changes. Patient feels like he is trapped in that they're being rough with him. The staff reports that he is agitated and belligerent. As a solution the staff is suggested for the patient to wear a condom catheter at night time although the patient does refuse. Staff is concerned about development of bed sores if he urinates in the bed without adequate hygiene. He still able walk and use a bedside commode without assistance.  Patient's son also reports increasing problems with paranoid thoughts and conspiracy theories. One such example includes a story that the patient tells about Monia Sabal, being in an ambulance, and the plate numbers being switched with the patient's own ambulance. Patient thinks that he is seen this on a TV program but someone is working to hide this. Patient also thinks that the staff at the facility is "running drugs" in the medicine cart. PRIOR HPI: 72 year old right-handed male with history of Parkinson's disease, depression, here for evaluation.  Patient was diagnosed Parkinson's disease 2007, after developing right upper extremity tremor.  Apparently he was  treated with carbidopa levodopa, then comtan was added.  He was also on Topamax, for unclear reasons.  May 2012 - patient's wife, who was his primary caregiver passed away.  2011/04/23- patient fell and had left hip fracture. He was treated with surgery.  Went to rehab, then moved to [place] to be closer to his son.  He was at [facility] inpatient rehabilitation, and now lives at [facility].  Since hip fracture, patient has had significant difficulty with ambulation. He is also been having trouble taking his medications due to complaint of headache. He also has intermittent hallucinations and confusion.   REVIEW OF SYSTEMS: Full 14 system review of systems performed and notable only for confusion weakness slurred speech tremor depression decreased energy for pain aching muscles incontinence hearing loss lung and legs.  ALLERGIES: No Known Allergies  HOME MEDICATIONS: Outpatient Prescriptions Prior to Visit  Medication Sig Dispense Refill  . baclofen (LIORESAL) 10 MG tablet Take 10 mg by mouth 3 (three) times daily. For spasms      . cholecalciferol (VITAMIN D) 1000 UNITS tablet Take 1,000 Units by mouth every morning.       . docusate sodium (COLACE) 100 MG capsule Take 100 mg by mouth every morning.       . donepezil (ARICEPT) 5 MG tablet Take 1 tablet (5 mg total) by mouth at bedtime.  30 tablet  0  . escitalopram (LEXAPRO) 20 MG tablet Take 20 mg by mouth every morning.       . fenofibrate (TRICOR) 48 MG tablet Take 48 mg by mouth every morning.      Marland Kitchen  ferrous sulfate 325 (65 FE) MG tablet Take 325 mg by mouth 2 (two) times daily.        . hydrochlorothiazide (HYDRODIURIL) 25 MG tablet Take 12.5 mg by mouth every morning.       . miconazole (MICOTIN) 2 % cream Apply 1 application topically as needed (burning). Apply to buttocks.      . Multiple Vitamins-Minerals (HCA SUPER THERAVITE-M PO) Take 1 tablet by mouth every morning.      . nystatin cream (MYCOSTATIN) Apply 1  application topically 2 (two) times daily. Apply to genital area      . potassium chloride SA (K-DUR,KLOR-CON) 20 MEQ tablet Take 20 mEq by mouth every morning.       . psyllium (REGULOID) 0.52 G capsule Take 0.52 g by mouth every morning.       Marland Kitchen QUEtiapine (SEROQUEL) 50 MG tablet Take 1 tablet (50 mg total) by mouth at bedtime.      . carbidopa-levodopa (SINEMET IR) 25-100 MG per tablet Take 1 tablet by mouth 4 (four) times daily.  120 tablet  0  . fluocinonide ointment (LIDEX) 0.05 % Apply 1 application topically every evening. Apply to rash on back       No facility-administered medications prior to visit.    PAST MEDICAL HISTORY: Past Medical History  Diagnosis Date  . Parkinson disease   . Seizures   . Depression   . Hypertension   . Dementia   . UTI (lower urinary tract infection)     PAST SURGICAL HISTORY: Past Surgical History  Procedure Laterality Date  . Hip fracture surgery      FAMILY HISTORY: History reviewed. No pertinent family history.  SOCIAL HISTORY:  History   Social History  . Marital Status: Widowed    Spouse Name: N/A    Number of Children: N/A  . Years of Education: N/A   Occupational History  . Not on file.   Social History Main Topics  . Smoking status: Former Smoker    Quit date: 03/20/1963  . Smokeless tobacco: Never Used  . Alcohol Use: Yes  . Drug Use: No  . Sexual Activity: No   Other Topics Concern  . Not on file   Social History Narrative  . No narrative on file     PHYSICAL EXAM  Filed Vitals:   08/25/13 1342  BP: 127/66  Pulse: 65  Temp: 97.9 F (36.6 C)  TempSrc: Oral    Not recorded    Cannot calculate BMI with a height equal to zero.  General: Patient is awake, alert and in no acute distress.  Well developed and groomed. Neck: Neck is supple. Cardiovascular: No carotid artery bruits.  Heart is regular rate and rhythm with no murmurs.  Neurologic Exam  Mental Status: Awake, alert.  Language is fluent  and comprehension intact.  PSYCHOMOTOR SLOWING.   Cranial Nerves: Pupils are equal and reactive to light.  Visual fields are full to confrontation.  Conjugate eye movements are full and symmetric.  Facial sensation and strength are symmetric.  Hearing is intact.  Palate elevated symmetrically and uvula is midline.  Shoulder shrug is symmetric.  Tongue is midline. SOFT VOICE, SLURRED SPEECH. Motor: Normal bulk.  MILD POSTURAL TREMOR.  BUE (L > R) COGWHEELING.  BUE AND BLE BRADYKINESIA (LEFT SLOWER THAN RIGHT).  Full strength in the upper extremities.  LLE LIMITED BY LEG HAMSTRING AND ADDUCTOR CONTRACTION.  No pronator drift. Sensory: ABSENT VIB AT TOES AND ANKLES. Coordination: No ataxia  or dysmetria on finger-nose or rapid alternating movement testing. Gait and Station: IN [place].  UNABLE TO STAND. STOOPED POSTURE. Reflexes: Deep tendon reflexes in the upper and lower extremity are TRACE and symmetric.   DIAGNOSTIC DATA (LABS, IMAGING, TESTING) - I reviewed patient records, labs, notes, testing and imaging myself where available.  Lab Results  Component Value Date   WBC 7.8 08/17/2013   HGB 12.7* 08/17/2013   HCT 37.6* 08/17/2013   MCV 88.5 08/17/2013   PLT 193 08/17/2013      Component Value Date/Time   NA 138 08/17/2013 2140   K 3.7 08/17/2013 2140   CL 103 08/17/2013 2140   CO2 25 08/17/2013 2140   GLUCOSE 115* 08/17/2013 2140   BUN 23 08/17/2013 2140   CREATININE 0.87 08/17/2013 2140   CALCIUM 9.3 08/17/2013 2140   PROT 6.8 03/19/2013 1400   ALBUMIN 3.4* 03/19/2013 1400   AST 17 03/19/2013 1400   ALT 6 03/19/2013 1400   ALKPHOS 50 03/19/2013 1400   BILITOT 0.5 03/19/2013 1400   GFRNONAA 84* 08/17/2013 2140   GFRAA >90 08/17/2013 2140   No results found for this basename: CHOL, HDL, LDLCALC, LDLDIRECT, TRIG, CHOLHDL   No results found for this basename: HGBA1C   Lab Results  Component Value Date   VITAMINB12 752 03/19/2013   Lab Results  Component Value Date   TSH 1.669 03/19/2013      ASSESSMENT AND PLAN  72 y.o. 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 Especially having difficulty with early AM stiffness, slowness.  PLAN: - Change carbidopa/levodopa to 1.5 tabs three times per day (5am, 10am, 4pm).  - Consider stopping baclofen if it has not been working. - Physical therapy.  Return in about 4 months (around 12/24/2013).    Suanne Marker, MD 08/25/2013, 2:43 PM Certified in Neurology, Neurophysiology and Neuroimaging  [facility] [address], [address] [place], [place] [address] [phone]

## 2013-09-22 ENCOUNTER — Other Ambulatory Visit: Payer: Self-pay | Admitting: Diagnostic Neuroimaging

## 2013-11-26 ENCOUNTER — Ambulatory Visit: Payer: Self-pay | Admitting: Diagnostic Neuroimaging

## 2013-12-28 ENCOUNTER — Encounter: Payer: Self-pay | Admitting: Diagnostic Neuroimaging

## 2013-12-28 ENCOUNTER — Ambulatory Visit (INDEPENDENT_AMBULATORY_CARE_PROVIDER_SITE_OTHER): Payer: Medicare Other | Admitting: Diagnostic Neuroimaging

## 2013-12-28 VITALS — BP 122/62 | HR 55

## 2013-12-28 DIAGNOSIS — F0280 Dementia in other diseases classified elsewhere without behavioral disturbance: Secondary | ICD-10-CM

## 2013-12-28 DIAGNOSIS — G20 Parkinson's disease: Secondary | ICD-10-CM

## 2013-12-28 NOTE — Patient Instructions (Signed)
Continue current meds 

## 2013-12-28 NOTE — Progress Notes (Signed)
[facility]  PATIENT: Joseph [name] DOB: November 11, 1940  REFERRING CLINICIAN:  HISTORY FROM: patient and son REASON FOR VISIT: follow up   HISTORICAL  CHIEF COMPLAINT:  Chief Complaint  Patient presents with  . Follow-up    PD    HISTORY OF PRESENT ILLNESS:   UPDATE 12/28/13: Since last visit, hallucinations, reality vs dream diff, continue. No threatening or frightening hallucinations. Tremor is stable. Getting daily therapy sessions.   UPDATE 08/25/13: Since last visit, having more stiffness, esp in legs, esp in early AM when getting out of bed. THis is intermittent, and random. More problems with urinary incontinence. Memory, hallucinations, paranoia, are stable.  UPDATE 05/01/12: Doing well. Better than last time. Behavior and throughts are better. Tremor is better. PT completed.  UPDATE 01/21/12: More problems with staff reporting agitation. Patient thinks that staff is "being rough". An example of conflict includes when the patient is woken up for bed and diaper changes. Patient feels like he is trapped in that they're being rough with him. The staff reports that he is agitated and belligerent. As a solution the staff is suggested for the patient to wear a condom catheter at night time although the patient does refuse. Staff is concerned about development of bed sores if he urinates in the bed without adequate hygiene. He still able walk and use a bedside commode without assistance.  Patient's son also reports increasing problems with paranoid thoughts and conspiracy theories. One such example includes a story that the patient tells about Monia SabalBill Clinton, being in an ambulance, and the plate numbers being switched with the patient's own ambulance. Patient thinks that he is seen this on a TV program but someone is working to hide this. Patient also thinks that the staff at the facility is "running drugs" in the medicine cart. PRIOR HPI: 73 year old right-handed male with  history of Parkinson's disease, depression, here for evaluation.  Patient was diagnosed Parkinson's disease 2007, after developing right upper extremity tremor.  Apparently he was treated with carbidopa levodopa, then comtan was added.  He was also on Topamax, for unclear reasons.  May 2012 - patient's wife, who was his primary caregiver passed away.  Aug 2012 - patient fell and had left hip fracture. He was treated with surgery.  Went to rehab, then moved to [place] to be closer to his son.  He was at [facility] inpatient rehabilitation, and now lives at [facility].  Since hip fracture, patient has had significant difficulty with ambulation. He is also been having trouble taking his medications due to complaint of headache. He also has intermittent hallucinations and confusion.   REVIEW OF SYSTEMS: Full 14 system review of systems performed and notable only for leg swelling drooling hearing loss I itching double vision memory loss dizziness weakness confusion hallucination frequent waking daytime sleepiness     ALLERGIES: No Known Allergies  HOME MEDICATIONS: Outpatient Prescriptions Prior to Visit  Medication Sig Dispense Refill  . carbidopa-levodopa (SINEMET IR) 25-100 MG per tablet Take 1.5 tablets by mouth 3 (three) times daily. At 5am, 10am, 4pm  135 tablet  1  . docusate sodium (COLACE) 100 MG capsule Take 100 mg by mouth every morning.       . escitalopram (LEXAPRO) 20 MG tablet Take 20 mg by mouth every morning.       . fenofibrate (TRICOR) 48 MG tablet Take 48 mg by mouth every morning.      . ferrous sulfate 325 (65 FE) MG tablet  Take 325 mg by mouth 2 (two) times daily.        . fluocinonide cream (LIDEX) 0.05 % Apply 1 application topically 2 (two) times daily.      . furosemide (LASIX) 20 MG tablet Take 1 tablet by mouth daily.      . hydrochlorothiazide (HYDRODIURIL) 25 MG tablet Take 12.5 mg by mouth every morning.       . loratadine (CLARITIN) 10 MG tablet  Take 10 mg by mouth daily.      . miconazole (MICOTIN) 2 % cream Apply 1 application topically as needed (burning). Apply to buttocks.      . Multiple Vitamins-Minerals (HCA SUPER THERAVITE-M PO) Take 1 tablet by mouth every morning.      . nystatin cream (MYCOSTATIN) Apply 1 application topically 2 (two) times daily. Apply to genital area      . potassium chloride SA (K-DUR,KLOR-CON) 20 MEQ tablet Take 20 mEq by mouth every morning.       . psyllium (REGULOID) 0.52 G capsule Take 0.52 g by mouth every morning.       Marland Kitchen. QUEtiapine (SEROQUEL) 50 MG tablet Take 1 tablet (50 mg total) by mouth at bedtime.      . Skin Protectants, Misc. (BAZA PROTECT EX) Apply topically as needed.      Marland Kitchen. ampicillin (PRINCIPEN) 500 MG capsule Take 500 mg by mouth 4 (four) times daily.      . baclofen (LIORESAL) 10 MG tablet Take 10 mg by mouth 3 (three) times daily. For spasms      . cholecalciferol (VITAMIN D) 1000 UNITS tablet Take 1,000 Units by mouth every morning.       . donepezil (ARICEPT) 5 MG tablet Take 1 tablet (5 mg total) by mouth at bedtime.  30 tablet  0  . doxycycline (MONODOX) 100 MG capsule Take 1 capsule by mouth 2 (two) times daily.       No facility-administered medications prior to visit.    PAST MEDICAL HISTORY: Past Medical History  Diagnosis Date  . Parkinson disease   . Seizures   . Depression   . Hypertension   . Dementia   . UTI (lower urinary tract infection)     PAST SURGICAL HISTORY: Past Surgical History  Procedure Laterality Date  . Hip fracture surgery      FAMILY HISTORY: Family History  Problem Relation Age of Onset  . Pancreatic cancer Mother   . Heart attack Father     SOCIAL HISTORY:  History   Social History  . Marital Status: Widowed    Spouse Name: N/A    Number of Children: 2  . Years of Education: HS   Occupational History  . retired    Social History Main Topics  . Smoking status: Former Smoker    Quit date: 03/20/1963  . Smokeless  tobacco: Never Used  . Alcohol Use: Yes  . Drug Use: No  . Sexual Activity: No   Other Topics Concern  . Not on file   Social History Narrative   Patient lives at home at [facility].   Caffeine Use: 2-3 cups daily     PHYSICAL EXAM  Filed Vitals:   12/28/13 1208  BP: 122/62  Pulse: 55    Not recorded    Cannot calculate BMI with a height equal to zero.  General: Patient is awake, alert and in no acute distress.  Well developed and groomed. Neck: Neck is supple. Cardiovascular: No carotid artery bruits.  Heart is regular rate and rhythm with no murmurs.  Neurologic Exam  Mental Status: Awake, alert.  Language is fluent and comprehension intact.  PSYCHOMOTOR SLOWING.   Cranial Nerves: Pupils are equal and reactive to light.  Visual fields are full to confrontation.  Conjugate eye movements are full and symmetric.  Facial sensation and strength are symmetric.  Hearing is intact.  Palate elevated symmetrically and uvula is midline.  Shoulder shrug is symmetric.  Tongue is midline. SOFT VOICE, SLURRED SPEECH. Motor: Normal bulk.  LUE REST TREMOR. MILD POSTURAL TREMOR.  BUE (L > R) COGWHEELING.  BUE AND BLE BRADYKINESIA (LEFT SLOWER THAN RIGHT).  Full strength in the upper extremities.  LLE LIMITED BY LEG HAMSTRING AND ADDUCTOR CONTRACTION.  No pronator drift. Sensory: ABSENT VIB AT TOES AND ANKLES. Coordination: No ataxia or dysmetria on finger-nose or rapid alternating movement testing. Gait and Station: IN [place].  UNABLE TO STAND. STOOPED POSTURE. Reflexes: Deep tendon reflexes in the upper and lower extremity are TRACE and symmetric.   DIAGNOSTIC DATA (LABS, IMAGING, TESTING) - I reviewed patient records, labs, notes, testing and imaging myself where available.  Lab Results  Component Value Date   WBC 7.8 08/17/2013   HGB 12.7* 08/17/2013   HCT 37.6* 08/17/2013   MCV 88.5 08/17/2013   PLT 193 08/17/2013      Component Value Date/Time   NA 138 08/17/2013 2140   K  3.7 08/17/2013 2140   CL 103 08/17/2013 2140   CO2 25 08/17/2013 2140   GLUCOSE 115* 08/17/2013 2140   BUN 23 08/17/2013 2140   CREATININE 0.87 08/17/2013 2140   CALCIUM 9.3 08/17/2013 2140   PROT 6.8 03/19/2013 1400   ALBUMIN 3.4* 03/19/2013 1400   AST 17 03/19/2013 1400   ALT 6 03/19/2013 1400   ALKPHOS 50 03/19/2013 1400   BILITOT 0.5 03/19/2013 1400   GFRNONAA 84* 08/17/2013 2140   GFRAA >90 08/17/2013 2140   No results found for this basename: CHOL,  HDL,  LDLCALC,  LDLDIRECT,  TRIG,  CHOLHDL   No results found for this basename: HGBA1C   Lab Results  Component Value Date   VITAMINB12 752 03/19/2013   Lab Results  Component Value Date   TSH 1.669 03/19/2013     ASSESSMENT AND PLAN  73 y.o. 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 Also with mild dementia and intermittent hallucinations (non-threatening).  PLAN: - continue carbidopa/levodopa 1.5 tabs three times per day (5am, 10am, 4pm).  - Physical therapy.  Return in about 6 months (around 06/29/2014).   Suanne Marker, MD 12/28/2013, 1:17 PM Certified in Neurology, Neurophysiology and Neuroimaging  [facility] [address] [place], [place] [address] [phone]

## 2014-05-26 ENCOUNTER — Emergency Department (HOSPITAL_COMMUNITY): Payer: Medicare Other

## 2014-05-26 ENCOUNTER — Inpatient Hospital Stay (HOSPITAL_COMMUNITY)
Admission: EM | Admit: 2014-05-26 | Discharge: 2014-06-02 | DRG: 689 | Disposition: A | Discharge status: Skilled Nursing Facility | Payer: Medicare Other | Attending: Internal Medicine | Admitting: Internal Medicine

## 2014-05-26 ENCOUNTER — Encounter (HOSPITAL_COMMUNITY): Payer: Self-pay | Admitting: Emergency Medicine

## 2014-05-26 DIAGNOSIS — T361X5A Adverse effect of cephalosporins and other beta-lactam antibiotics, initial encounter: Secondary | ICD-10-CM | POA: Diagnosis not present

## 2014-05-26 DIAGNOSIS — R509 Fever, unspecified: Secondary | ICD-10-CM | POA: Diagnosis present

## 2014-05-26 DIAGNOSIS — F0391 Unspecified dementia with behavioral disturbance: Secondary | ICD-10-CM | POA: Diagnosis present

## 2014-05-26 DIAGNOSIS — G9341 Metabolic encephalopathy: Secondary | ICD-10-CM

## 2014-05-26 DIAGNOSIS — F03918 Unspecified dementia, unspecified severity, with other behavioral disturbance: Secondary | ICD-10-CM | POA: Diagnosis present

## 2014-05-26 DIAGNOSIS — N390 Urinary tract infection, site not specified: Secondary | ICD-10-CM | POA: Diagnosis not present

## 2014-05-26 DIAGNOSIS — F3289 Other specified depressive episodes: Secondary | ICD-10-CM | POA: Diagnosis present

## 2014-05-26 DIAGNOSIS — I509 Heart failure, unspecified: Secondary | ICD-10-CM | POA: Diagnosis present

## 2014-05-26 DIAGNOSIS — I5030 Unspecified diastolic (congestive) heart failure: Secondary | ICD-10-CM | POA: Diagnosis present

## 2014-05-26 DIAGNOSIS — I10 Essential (primary) hypertension: Secondary | ICD-10-CM | POA: Diagnosis present

## 2014-05-26 DIAGNOSIS — G20 Parkinson's disease: Secondary | ICD-10-CM

## 2014-05-26 DIAGNOSIS — G9340 Encephalopathy, unspecified: Secondary | ICD-10-CM | POA: Diagnosis present

## 2014-05-26 DIAGNOSIS — R5381 Other malaise: Secondary | ICD-10-CM | POA: Diagnosis present

## 2014-05-26 DIAGNOSIS — G3183 Dementia with Lewy bodies: Secondary | ICD-10-CM

## 2014-05-26 DIAGNOSIS — B964 Proteus (mirabilis) (morganii) as the cause of diseases classified elsewhere: Secondary | ICD-10-CM | POA: Diagnosis present

## 2014-05-26 DIAGNOSIS — I5032 Chronic diastolic (congestive) heart failure: Secondary | ICD-10-CM

## 2014-05-26 DIAGNOSIS — L270 Generalized skin eruption due to drugs and medicaments taken internally: Secondary | ICD-10-CM | POA: Diagnosis not present

## 2014-05-26 DIAGNOSIS — F329 Major depressive disorder, single episode, unspecified: Secondary | ICD-10-CM | POA: Diagnosis present

## 2014-05-26 DIAGNOSIS — F0280 Dementia in other diseases classified elsewhere without behavioral disturbance: Secondary | ICD-10-CM | POA: Diagnosis present

## 2014-05-26 DIAGNOSIS — E876 Hypokalemia: Secondary | ICD-10-CM | POA: Diagnosis present

## 2014-05-26 LAB — URINALYSIS, ROUTINE W REFLEX MICROSCOPIC
BILIRUBIN URINE: NEGATIVE
GLUCOSE, UA: NEGATIVE mg/dL
Ketones, ur: NEGATIVE mg/dL
Nitrite: NEGATIVE
PH: 8.5 — AB (ref 5.0–8.0)
Protein, ur: 100 mg/dL — AB
Specific Gravity, Urine: 1.021 (ref 1.005–1.030)
Urobilinogen, UA: 1 mg/dL (ref 0.0–1.0)

## 2014-05-26 LAB — CBC WITH DIFFERENTIAL/PLATELET
Basophils Absolute: 0 10*3/uL (ref 0.0–0.1)
Basophils Relative: 0 % (ref 0–1)
EOS PCT: 4 % (ref 0–5)
Eosinophils Absolute: 0.5 10*3/uL (ref 0.0–0.7)
HEMATOCRIT: 39.5 % (ref 39.0–52.0)
HEMOGLOBIN: 13.5 g/dL (ref 13.0–17.0)
LYMPHS ABS: 1.1 10*3/uL (ref 0.7–4.0)
LYMPHS PCT: 9 % — AB (ref 12–46)
MCH: 30.3 pg (ref 26.0–34.0)
MCHC: 34.2 g/dL (ref 30.0–36.0)
MCV: 88.8 fL (ref 78.0–100.0)
MONO ABS: 1 10*3/uL (ref 0.1–1.0)
Monocytes Relative: 8 % (ref 3–12)
NEUTROS ABS: 9.7 10*3/uL — AB (ref 1.7–7.7)
Neutrophils Relative %: 79 % — ABNORMAL HIGH (ref 43–77)
Platelets: 224 10*3/uL (ref 150–400)
RBC: 4.45 MIL/uL (ref 4.22–5.81)
RDW: 12.6 % (ref 11.5–15.5)
WBC: 12.2 10*3/uL — ABNORMAL HIGH (ref 4.0–10.5)

## 2014-05-26 LAB — BASIC METABOLIC PANEL
ANION GAP: 13 (ref 5–15)
BUN: 21 mg/dL (ref 6–23)
CALCIUM: 8.7 mg/dL (ref 8.4–10.5)
CO2: 26 mEq/L (ref 19–32)
Chloride: 103 mEq/L (ref 96–112)
Creatinine, Ser: 0.72 mg/dL (ref 0.50–1.35)
GFR calc Af Amer: >90 mL/min (ref >90)
GFR, EST NON AFRICAN AMERICAN: 90 mL/min — AB (ref >90)
Glucose, Bld: 102 mg/dL — ABNORMAL HIGH (ref 70–99)
Potassium: 3.6 mEq/L — ABNORMAL LOW (ref 3.7–5.3)
SODIUM: 142 meq/L (ref 137–147)

## 2014-05-26 LAB — HEPATIC FUNCTION PANEL
ALT: 10 U/L (ref 0–53)
AST: 13 U/L (ref 0–37)
Albumin: 3.8 g/dL (ref 3.5–5.2)
Alkaline Phosphatase: 61 U/L (ref 39–117)
BILIRUBIN TOTAL: 0.6 mg/dL (ref 0.3–1.2)
Bilirubin, Direct: <0.2 mg/dL (ref 0.0–0.3)
Total Protein: 7.6 g/dL (ref 6.0–8.3)

## 2014-05-26 LAB — PRO B NATRIURETIC PEPTIDE: PRO B NATRI PEPTIDE: 354 pg/mL — AB (ref 0–125)

## 2014-05-26 LAB — CBG MONITORING, ED: GLUCOSE-CAPILLARY: 102 mg/dL — AB (ref 70–99)

## 2014-05-26 LAB — I-STAT CG4 LACTIC ACID, ED: Lactic Acid, Venous: 0.48 mmol/L — ABNORMAL LOW (ref 0.5–2.2)

## 2014-05-26 MED ORDER — DEXTROSE 5 % IV SOLN
1.0000 g | Freq: Once | INTRAVENOUS | Status: AC
  Administered 2014-05-26: 1 g via INTRAVENOUS
  Filled 2014-05-26: qty 10

## 2014-05-26 MED ORDER — ALBUTEROL (5 MG/ML) CONTINUOUS INHALATION SOLN: 10.0000 mg/h | INHALATION_SOLUTION | Freq: Once | RESPIRATORY_TRACT | Status: DC

## 2014-05-26 NOTE — ED Provider Notes (Signed)
CSN: [id number]     Arrival date & time 05/26/14  2051 History   First MD Initiated Contact with Patient 05/26/14 2054     Chief Complaint  Patient presents with  . Fever  . Altered Mental Status     Patient is a 73 y.o. male presenting with fever. The history is provided by the EMS personnel and the nursing home.  Fever Max temp prior to arrival:  100.3 Temp source:  Oral Duration:  1 day Timing:  Constant Progression:  Unchanged Chronicity:  New Relieved by:  Nothing Worsened by:  Nothing tried Ineffective treatments:  None tried Associated symptoms: confusion, congestion and cough (yellow)   Associated symptoms: no diarrhea, no rash, no rhinorrhea and no vomiting   Cough:    Cough characteristics:  Productive   Duration:  1 day Risk factors: sick contacts    Nursing home states pt has been more confused since earlier in day. At baseline is able to carry a conversation and is AAOx2( person place). Nonambulatory at baseline and typically very rigid due to Parkinsons.  They note he has had a temp of 100.3 New symptoms appears to be new cough productive and congestion.    Past Medical History  Diagnosis Date  . Parkinson disease   . Seizures   . Depression   . Hypertension   . Dementia   . UTI (lower urinary tract infection)    Past Surgical History  Procedure Laterality Date  . Hip fracture surgery     Family History  Problem Relation Age of Onset  . Pancreatic cancer Mother   . Heart attack Father    History  Substance Use Topics  . Smoking status: Former Smoker    Quit date: 03/20/1963  . Smokeless tobacco: Never Used  . Alcohol Use: Yes    Review of Systems  Unable to perform ROS: Dementia  Constitutional: Positive for fever.  HENT: Positive for congestion. Negative for rhinorrhea.   Respiratory: Positive for cough (yellow). Negative for shortness of breath.   Gastrointestinal: Negative for vomiting and diarrhea.  Skin: Negative for rash.   Psychiatric/Behavioral: Positive for confusion.      Allergies  Review of patient's allergies indicates no known allergies.  Home Medications   Prior to Admission medications   Medication Sig Start Date End Date Taking? Authorizing Provider  carbidopa-levodopa (SINEMET IR) 25-100 MG per tablet Take 1.5 tablets by mouth 3 (three) times daily. At 5am, 10am, 4pm 09/22/13   Suanne Marker, MD  docusate sodium (COLACE) 100 MG capsule Take 100 mg by mouth every morning.     Historical Provider, MD  escitalopram (LEXAPRO) 20 MG tablet Take 20 mg by mouth every morning.     Historical Provider, MD  fenofibrate (TRICOR) 48 MG tablet Take 48 mg by mouth every morning.    Historical Provider, MD  ferrous sulfate 325 (65 FE) MG tablet Take 325 mg by mouth 2 (two) times daily.      Historical Provider, MD  fluocinonide cream (LIDEX) 0.05 % Apply 1 application topically 2 (two) times daily.    Historical Provider, MD  furosemide (LASIX) 20 MG tablet Take 1 tablet by mouth daily. 08/18/13   Historical Provider, MD  hydrochlorothiazide (HYDRODIURIL) 25 MG tablet Take 12.5 mg by mouth every morning.     Historical Provider, MD  loratadine (CLARITIN) 10 MG tablet Take 10 mg by mouth daily.    Historical Provider, MD  miconazole (MICOTIN) 2 % cream Apply 1 application  topically as needed (burning). Apply to buttocks.    Historical Provider, MD  Multiple Vitamins-Minerals (HCA SUPER THERAVITE-M PO) Take 1 tablet by mouth every morning.    Historical Provider, MD  nystatin cream (MYCOSTATIN) Apply 1 application topically 2 (two) times daily. Apply to genital area    Historical Provider, MD  potassium chloride SA (K-DUR,KLOR-CON) 20 MEQ tablet Take 20 mEq by mouth every morning.     Historical Provider, MD  psyllium (REGULOID) 0.52 G capsule Take 0.52 g by mouth every morning.     Historical Provider, MD  QUEtiapine (SEROQUEL) 50 MG tablet Take 1 tablet (50 mg total) by mouth at bedtime. 03/20/13   Elease Etienne, MD  Skin Protectants, Misc. (BAZA PROTECT EX) Apply topically as needed.    Historical Provider, MD   BP 107/45  Pulse 67  Temp(Src) 99.1 F (37.3 C) (Rectal)  Resp 21  Ht  (1.778 m)  Wt 227 lb (102.967 kg)  BMI 32.57 kg/m2  SpO2 96% Physical Exam  Nursing note and vitals reviewed. Constitutional:  Dementia, does not follow commands  HENT:  Head: Normocephalic and atraumatic.  +nasal congestion and mucosal edema.   Eyes: Conjunctivae are normal.  No conj erythema but yellow/green crusting over eyelids.   Neck: Normal range of motion. Neck supple. No tracheal deviation present.  Rigid neck (always so per NH)  Cardiovascular: Normal rate, regular rhythm and normal heart sounds.   No murmur heard. Pulmonary/Chest: Effort normal. No respiratory distress. He has no rales.  Coarse breath sounds diffusely, rhonchi throughout  Abdominal: Soft. Bowel sounds are normal. He exhibits no distension and no mass. There is no tenderness.  Musculoskeletal: Normal range of motion. He exhibits no edema.  Neurological: He is alert.  Intermittently somnolent, awakens easily to vocal stimulation. Oriented to name, not time or place. Nonambulatory. Increased tone of all extremities, R>L. +cogwheel rigidity. +tremors.  Skin: Skin is warm and dry. No rash noted.    ED Course  Procedures (including critical care time) Labs Review Labs Reviewed  CBC WITH DIFFERENTIAL - Abnormal; Notable for the following:    WBC 12.2 (*)    Neutrophils Relative % 79 (*)    Neutro Abs 9.7 (*)    Lymphocytes Relative 9 (*)    All other components within normal limits  URINALYSIS, ROUTINE W REFLEX MICROSCOPIC - Abnormal; Notable for the following:    Color, Urine AMBER (*)    APPearance TURBID (*)    pH 8.5 (*)    Hgb urine dipstick SMALL (*)    Protein, ur 100 (*)    Leukocytes, UA LARGE (*)    All other components within normal limits  PRO B NATRIURETIC PEPTIDE - Abnormal; Notable for the  following:    Pro B Natriuretic peptide (BNP) 354.0 (*)    All other components within normal limits  BASIC METABOLIC PANEL - Abnormal; Notable for the following:    Potassium 3.6 (*)    Glucose, Bld 102 (*)    GFR calc non Af Amer 90 (*)    All other components within normal limits  URINE MICROSCOPIC-ADD ON - Abnormal; Notable for the following:    Bacteria, UA FEW (*)    All other components within normal limits  CBG MONITORING, ED - Abnormal; Notable for the following:    Glucose-Capillary 102 (*)    All other components within normal limits  I-STAT CG4 LACTIC ACID, ED - Abnormal; Notable for the following:  Lactic Acid, Venous 0.48 (*)    All other components within normal limits  CULTURE, BLOOD (ROUTINE X 2)  CULTURE, BLOOD (ROUTINE X 2)  HEPATIC FUNCTION PANEL    Imaging Review Dg Chest 1 View  05/26/2014   CLINICAL DATA:  Fever, altered mental status  EXAM: CHEST - 1 VIEW  COMPARISON:  03/19/2013  FINDINGS: Low lung volumes persist with central vascular congestion and diffuse prominence of the interstitial markings but no focal acute finding. The cardiomegaly again noted. Bilateral glenohumeral joint and AC joint degenerative change.  IMPRESSION: Stable findings as above.  No new acute focal finding.   Electronically Signed   By: Christiana Pellant M.D.   On: 05/26/2014 22:49     EKG Interpretation None      MDM   Final diagnoses:  Acute encephalopathy  Dementia in Parkinson's disease  UTI (lower urinary tract infection)  Hypokalemia    Pt with dementia and Parkinsons with recurrent UTIs presents for AMS and fever. Onset earlier in day, outside stroke window. No new focal neuro deficit noted.  Afebrile here.  Confused from baseline.  Considered URI vs lower resp infection as source.  UA sent, BC x2. Neck is very stiff but he has significantly increased tone throughout from Parkinson's for which NH agrees with.  May be polypharmacy related, but cannot emergently rule  in/out at this time.  No seizures noted, doubt encephalitis. No significant electrolyte abnormality.  Deferred CT imaging without new neuro deficit and infectious etiology more likely.  UA with infectious markers, treat for UTI. Left shift leukocytosis.   Admit to hospitalist.   Sofie Rower, MD 05/27/14 [phone]

## 2014-05-26 NOTE — ED Notes (Signed)
CBG 102 

## 2014-05-26 NOTE — ED Notes (Addendum)
From [facility] nursing facility with fever, ALOC and productive cough, for one day and a half. pt has parkinsons and memory issues but is usually alert and able to answer questions- at this time pt is unable to speak and is hot to touch.

## 2014-05-26 NOTE — ED Notes (Signed)
Spoke with lab - they will add on BNP

## 2014-05-26 NOTE — ED Notes (Signed)
Orders placed on wrong pt.

## 2014-05-27 DIAGNOSIS — F329 Major depressive disorder, single episode, unspecified: Secondary | ICD-10-CM | POA: Diagnosis present

## 2014-05-27 DIAGNOSIS — B964 Proteus (mirabilis) (morganii) as the cause of diseases classified elsewhere: Secondary | ICD-10-CM | POA: Diagnosis present

## 2014-05-27 DIAGNOSIS — I509 Heart failure, unspecified: Secondary | ICD-10-CM | POA: Diagnosis present

## 2014-05-27 DIAGNOSIS — F3289 Other specified depressive episodes: Secondary | ICD-10-CM | POA: Diagnosis present

## 2014-05-27 DIAGNOSIS — N390 Urinary tract infection, site not specified: Secondary | ICD-10-CM | POA: Diagnosis present

## 2014-05-27 DIAGNOSIS — F0280 Dementia in other diseases classified elsewhere without behavioral disturbance: Secondary | ICD-10-CM | POA: Diagnosis present

## 2014-05-27 DIAGNOSIS — R509 Fever, unspecified: Secondary | ICD-10-CM | POA: Diagnosis present

## 2014-05-27 DIAGNOSIS — L270 Generalized skin eruption due to drugs and medicaments taken internally: Secondary | ICD-10-CM | POA: Diagnosis not present

## 2014-05-27 DIAGNOSIS — T361X5A Adverse effect of cephalosporins and other beta-lactam antibiotics, initial encounter: Secondary | ICD-10-CM | POA: Diagnosis not present

## 2014-05-27 DIAGNOSIS — G9340 Encephalopathy, unspecified: Secondary | ICD-10-CM | POA: Diagnosis present

## 2014-05-27 DIAGNOSIS — E876 Hypokalemia: Secondary | ICD-10-CM | POA: Diagnosis present

## 2014-05-27 DIAGNOSIS — R5381 Other malaise: Secondary | ICD-10-CM | POA: Diagnosis present

## 2014-05-27 DIAGNOSIS — I10 Essential (primary) hypertension: Secondary | ICD-10-CM | POA: Diagnosis present

## 2014-05-27 DIAGNOSIS — I5030 Unspecified diastolic (congestive) heart failure: Secondary | ICD-10-CM | POA: Diagnosis present

## 2014-05-27 LAB — COMPREHENSIVE METABOLIC PANEL
ALK PHOS: 50 U/L (ref 39–117)
ALT: 9 U/L (ref 0–53)
AST: 12 U/L (ref 0–37)
Albumin: 3.2 g/dL — ABNORMAL LOW (ref 3.5–5.2)
Anion gap: 13 (ref 5–15)
BUN: 21 mg/dL (ref 6–23)
CO2: 25 meq/L (ref 19–32)
Calcium: 8.8 mg/dL (ref 8.4–10.5)
Chloride: 102 mEq/L (ref 96–112)
Creatinine, Ser: 0.73 mg/dL (ref 0.50–1.35)
GFR calc non Af Amer: 89 mL/min — ABNORMAL LOW (ref >90)
Glucose, Bld: 119 mg/dL — ABNORMAL HIGH (ref 70–99)
POTASSIUM: 3.7 meq/L (ref 3.7–5.3)
SODIUM: 140 meq/L (ref 137–147)
TOTAL PROTEIN: 6.3 g/dL (ref 6.0–8.3)
Total Bilirubin: 0.6 mg/dL (ref 0.3–1.2)

## 2014-05-27 LAB — CBC WITH DIFFERENTIAL/PLATELET
BASOS PCT: 0 % (ref 0–1)
Basophils Absolute: 0 10*3/uL (ref 0.0–0.1)
EOS ABS: 0.4 10*3/uL (ref 0.0–0.7)
EOS PCT: 5 % (ref 0–5)
HCT: 36.8 % — ABNORMAL LOW (ref 39.0–52.0)
HEMOGLOBIN: 12.6 g/dL — AB (ref 13.0–17.0)
Lymphocytes Relative: 11 % — ABNORMAL LOW (ref 12–46)
Lymphs Abs: 1.1 10*3/uL (ref 0.7–4.0)
MCH: 30.4 pg (ref 26.0–34.0)
MCHC: 34.2 g/dL (ref 30.0–36.0)
MCV: 88.7 fL (ref 78.0–100.0)
MONOS PCT: 10 % (ref 3–12)
Monocytes Absolute: 0.9 10*3/uL (ref 0.1–1.0)
NEUTROS PCT: 74 % (ref 43–77)
Neutro Abs: 7.2 10*3/uL (ref 1.7–7.7)
Platelets: 203 10*3/uL (ref 150–400)
RBC: 4.15 MIL/uL — ABNORMAL LOW (ref 4.22–5.81)
RDW: 12.6 % (ref 11.5–15.5)
WBC: 9.7 10*3/uL (ref 4.0–10.5)

## 2014-05-27 LAB — CBC
HCT: 37.7 % — ABNORMAL LOW (ref 39.0–52.0)
HEMOGLOBIN: 12.9 g/dL — AB (ref 13.0–17.0)
MCH: 30.9 pg (ref 26.0–34.0)
MCHC: 34.2 g/dL (ref 30.0–36.0)
MCV: 90.2 fL (ref 78.0–100.0)
Platelets: 213 10*3/uL (ref 150–400)
RBC: 4.18 MIL/uL — AB (ref 4.22–5.81)
RDW: 12.6 % (ref 11.5–15.5)
WBC: 10.1 10*3/uL (ref 4.0–10.5)

## 2014-05-27 LAB — URINE MICROSCOPIC-ADD ON

## 2014-05-27 LAB — MRSA PCR SCREENING: MRSA by PCR: NEGATIVE

## 2014-05-27 LAB — MAGNESIUM: MAGNESIUM: 2.1 mg/dL (ref 1.5–2.5)

## 2014-05-27 LAB — CREATININE, SERUM
CREATININE: 0.75 mg/dL (ref 0.50–1.35)
GFR calc Af Amer: >90 mL/min (ref >90)
GFR calc non Af Amer: 88 mL/min — ABNORMAL LOW (ref >90)

## 2014-05-27 LAB — AMMONIA: AMMONIA: 45 umol/L (ref 11–60)

## 2014-05-27 MED ORDER — SODIUM CHLORIDE 0.9 % IV SOLN
1250.0000 mg | Freq: Two times a day (BID) | INTRAVENOUS | Status: DC
  Administered 2014-05-28 – 2014-05-30 (×5): 1250 mg via INTRAVENOUS
  Filled 2014-05-27 (×5): qty 1250

## 2014-05-27 MED ORDER — DEXTROSE 5 % IV SOLN
1.0000 g | INTRAVENOUS | Status: DC
  Administered 2014-05-27 – 2014-05-28 (×2): 1 g via INTRAVENOUS
  Filled 2014-05-27 (×3): qty 10

## 2014-05-27 MED ORDER — HEPARIN SODIUM (PORCINE) 5000 UNIT/ML IJ SOLN
5000.0000 [IU] | Freq: Three times a day (TID) | INTRAMUSCULAR | Status: DC
  Administered 2014-05-27 – 2014-06-02 (×21): 5000 [IU] via SUBCUTANEOUS
  Filled 2014-05-27 (×26): qty 1

## 2014-05-27 MED ORDER — AZITHROMYCIN 500 MG IV SOLR
500.0000 mg | INTRAVENOUS | Status: DC
  Administered 2014-05-27 – 2014-05-28 (×2): 500 mg via INTRAVENOUS
  Filled 2014-05-27 (×2): qty 500

## 2014-05-27 MED ORDER — POTASSIUM CHLORIDE IN NACL 20-0.9 MEQ/L-% IV SOLN
INTRAVENOUS | Status: DC
  Administered 2014-05-27: 03:00:00 via INTRAVENOUS
  Filled 2014-05-27 (×3): qty 1000

## 2014-05-27 MED ORDER — CHLORHEXIDINE GLUCONATE 0.12 % MT SOLN
15.0000 mL | Freq: Two times a day (BID) | OROMUCOSAL | Status: DC
  Administered 2014-05-27 – 2014-06-02 (×8): 15 mL via OROMUCOSAL
  Filled 2014-05-27 (×7): qty 15

## 2014-05-27 MED ORDER — VANCOMYCIN HCL 10 G IV SOLR
1500.0000 mg | Freq: Once | INTRAVENOUS | Status: AC
  Administered 2014-05-28: 1500 mg via INTRAVENOUS
  Filled 2014-05-27: qty 1500

## 2014-05-27 MED ORDER — CETYLPYRIDINIUM CHLORIDE 0.05 % MT LIQD
7.0000 mL | Freq: Two times a day (BID) | OROMUCOSAL | Status: DC
  Administered 2014-05-27 – 2014-06-02 (×11): 7 mL via OROMUCOSAL

## 2014-05-27 MED ORDER — DEXTROSE 5 % IV SOLN: 1.0000 g | INTRAVENOUS | Status: DC

## 2014-05-27 NOTE — Progress Notes (Signed)
Patient was transferred from the ED to 6N. He is oriented to person, disoriented to place, time, and situation. Responds occasionally to his name. He has multiple sores on his tongue and gums. Blanchable redness on sacrum and flank. Barrier cream applied. Patient is now resting in room, will continue to monitor.

## 2014-05-27 NOTE — ED Notes (Signed)
Called lab to have labs added on for magnesium. Will collect CBC.

## 2014-05-27 NOTE — Progress Notes (Signed)
ANTIBIOTIC CONSULT NOTE - INITIAL  Pharmacy Consult for Vancomycin  Indication: Bacteremia  No Known Allergies  Patient Measurements: Height:  (177.8 cm) Weight: 230 lb 9.6 oz (104.599 kg) IBW/kg (Calculated) : 73  Vital Signs: Temp: 98.7 F (37.1 C) (09/17 1402) Temp src: Oral (09/17 1402) BP: 137/58 mmHg (09/17 2153) Pulse Rate: 63 (09/17 2153)  Labs:  Recent Labs  05/26/14 2105 05/26/14 2251 05/27/14 0059 05/27/14 0150  WBC 12.2*  --  10.1 9.7  HGB 13.5  --  12.9* 12.6*  PLT 224  --  213 203  CREATININE  --  0.72  0.75  --  0.73   Estimated Creatinine Clearance: 98.1 ml/min (by C-G formula based on Cr of 0.73).  Microbiology: Recent Results (from the past 720 hour(s))  CULTURE, BLOOD (ROUTINE X 2)     Status: None   Collection Time    05/26/14  9:10 PM      Result Value Ref Range Status   Specimen Description BLOOD RIGHT ARM   Final   Special Requests BOTTLES DRAWN AEROBIC AND ANAEROBIC [facility] EACH   Final   Culture  Setup Time     Final   Value: 05/27/2014 04:19     Performed at [facility]   Culture     Final   Value: GRAM POSITIVE COCCI IN CLUSTERS     Note: Gram Stain Report Called to,Read Back By and Verified With: LOURDES ABIERA ON 05/27/2014 AT 11:17P BY WILEJ     Performed at [facility]   Report Status PENDING   Incomplete  MRSA PCR SCREENING     Status: None   Collection Time    05/27/14  2:13 AM      Result Value Ref Range Status   MRSA by PCR NEGATIVE  NEGATIVE Final   Comment:            The GeneXpert MRSA Assay (FDA     approved for NASAL specimens     only), is one component of a     comprehensive MRSA colonization     surveillance program. It is not     intended to diagnose MRSA     infection nor to guide or     monitor treatment for     MRSA infections.    Medical History: Past Medical History  Diagnosis Date  . Parkinson disease   . Seizures   . Depression   . Hypertension   . Dementia   . UTI (lower  urinary tract infection)    Assessment: 73 y/o M already on ceftriaxone/azithromycin now with blood cultures showing GPC in clusters, adding vancomycin. WBC wnl, renal function appropriate for age, other labs as above.   Goal of Therapy:  Vancomycin trough level 15-20 mcg/ml  Plan:  -Vancomycin 1500 mg IV x1, then 1250 mg IV q12h -Trend WBC, temp, renal function  -Drug levels as indicated  -De-escalate based on cultures/speciation/sensi  Joseph [name] 05/27/2014,11:35 PM

## 2014-05-27 NOTE — Progress Notes (Signed)
Patient seen and evaluated earlier this AM by my associate. Please refer to H and P regarding assessment and plan.  Will reassess next am.  Joseph [name]

## 2014-05-27 NOTE — Clinical Social Work Note (Signed)
CSW attempted to call patients three children to discuss patient's stay with assisted living.  CSW was unable to reach patients children and left a message requesting them to call back.  CSW will continue to follow.  Merlyn Lot, LCSWA Clinical Social Worker [phone]

## 2014-05-27 NOTE — Progress Notes (Signed)
Utilization Review Completed.Joseph [name] T9/17/2015  

## 2014-05-27 NOTE — Clinical Social Work Psychosocial (Signed)
Clinical Social Work Department BRIEF PSYCHOSOCIAL ASSESSMENT 05/27/2014  Patient:  Joseph [name], Joseph [name]     Account Number:  [id number]     Admit date:  05/26/2014  Clinical Social Worker:  Merlyn Lot, CLINICAL SOCIAL WORKER  Date/Time:  05/27/2014 02:04 PM  Referred by:  Physician  Date Referred:  05/27/2014 Referred for  Other - See comment   Other Referral:   return to Assisted living   Interview type:  Family Other interview type:    PSYCHOSOCIAL DATA Living Status:  FACILITY Admitted from facility:  [facility] Level of care:  Assisted Living Primary support name:  Treyden Hakim Primary support relationship to patient:  CHILD, ADULT Degree of support available:   Spoke with patients daughter, Mrs. Alejandro Mulling, and was informed that patient has high level of support.    CURRENT CONCERNS Current Concerns  Post-Acute Placement   Other Concerns:    SOCIAL WORK ASSESSMENT / PLAN CSW spoke with patients daughter about discharge disposition.  Patients daughter confirmed that patient is from [facility] and stated that patient would return there after discharge. CSW will continue to follow   Assessment/plan status:  Psychosocial Support/Ongoing Assessment of Needs Other assessment/ plan:   FL2   Information/referral to community resources:   [facility]    PATIENT'S/FAMILY'S RESPONSE TO PLAN OF CARE: Patients  daughter is agreeable to patient return to ALF and appreciated CSW involvment       Merlyn Lot, [facility] Clinical Social Worker [phone]

## 2014-05-27 NOTE — H&P (Addendum)
Hospitalist Admission History and Physical  Patient name: Joseph [name] Medical record number: [id number] Date of birth: November 20, 1940 Age: 73 y.o. Gender: male  Primary Care Provider: Florentina Jenny, MD  Chief Complaint: encephalopathy, UTI   History of Present Illness:This is a 73 y.o. year old male with significant past medical history of parkinsons dementia, HTN, diastolic CHF, depression presenting with encephalopathy, UTI. Level V caveat as pt is acutely confused. Per report, pt noted to be febrile and with worsening confusion from baseline. Reports + cough over past week per report and nasal drainage. Per report, pt usually fairly alert and able to answer questions. However, persistently confused over past 24 hours.  Presented to ER, tmax 99.1, HR in 60s, BP 100s-150s systolic, satting 96% on RA. WBC 12.2, Hgb 13.5, K 3.6, Cr 0.72. ProBNP 350 (baseline), lactate 0.4. EKG sinus rhythm. CXR with no acute findings. UA concerning for infection. Started on rocephin.  Assessment and Plan: [name] is a 73 y.o. year old male presenting with encephalopathy, UTI   Active Problems:   Encephalopathy   UTI (lower urinary tract infection)   1-Encephalopathy/UTI -likely multifactorial with contributions including baseline dementia, toxic-metabolic, infection -continue rocephin -add on azithro for upper resp coverage given upper resp sxs over past 3-4 days (would meet HCAP criteria, however CXR preliminarily negative) -repeat imaging in am  -pan culture  -check ammonia  -gently hydrate  -HOLD meds overnight (noted hx/o medication induced encephalopathy) -reassess in am   2-Parkinsons  -at baseline  -hold meds in the interim  3-HTN/CHF  -Euvolemic -hold meds overnight    FEN/GI: NPO  Prophylaxis: sub q heparin  Disposition: pending further evaluation  Code Status:Full Code    Patient Active Problem List   Diagnosis Date Noted  . Encephalopathy 05/27/2014  . Acute encephalopathy  03/20/2013  . Metabolic encephalopathy 03/19/2013  . Diastolic heart failure 06/06/2012  . Hypertension 05/29/2012  . Dementia in Parkinson's disease 05/29/2012  . Dementia with behavioral disturbance 05/29/2012  . Parkinson disease, symptomatic 05/26/2012  . Dementia 05/26/2012  . Bradycardia 05/26/2012  . PVC's (premature ventricular contractions) 05/23/2012  . Bigeminy 05/23/2012   Past Medical History: Past Medical History  Diagnosis Date  . Parkinson disease   . Seizures   . Depression   . Hypertension   . Dementia   . UTI (lower urinary tract infection)     Past Surgical History: Past Surgical History  Procedure Laterality Date  . Hip fracture surgery      Social History: History   Social History  . Marital Status: Widowed    Spouse Name: N/A    Number of Children: 2  . Years of Education: HS   Occupational History  . retired    Social History Main Topics  . Smoking status: Former Smoker    Quit date: 03/20/1963  . Smokeless tobacco: Never Used  . Alcohol Use: Yes  . Drug Use: No  . Sexual Activity: No   Other Topics Concern  . None   Social History Narrative   Patient lives at home at [facility].   Caffeine Use: 2-3 cups daily    Family History: Family History  Problem Relation Age of Onset  . Pancreatic cancer Mother   . Heart attack Father     Allergies: No Known Allergies  Current Facility-Administered Medications  Medication Dose Route Frequency Provider Last Rate Last Dose  . 0.9 % NaCl with KCl 20 mEq/ L  infusion   Intravenous Continuous Doree Albee, MD      .  azithromycin (ZITHROMAX) 500 mg in dextrose 5 % 250 mL IVPB  500 mg Intravenous Q24H Doree Albee, MD      . cefTRIAXone (ROCEPHIN) 1 g in dextrose 5 % 50 mL IVPB  1 g Intravenous Q24H Doree Albee, MD      . heparin injection 5,000 Units  5,000 Units Subcutaneous 3 times per day Doree Albee, MD       Current Outpatient Prescriptions  Medication Sig Dispense Refill   . carbidopa-levodopa (SINEMET IR) 25-100 MG per tablet Take 1.5 tablets by mouth 3 (three) times daily. At 5am, 10am, 4pm  135 tablet  1  . docusate sodium (COLACE) 100 MG capsule Take 100 mg by mouth every morning.       . escitalopram (LEXAPRO) 20 MG tablet Take 20 mg by mouth every morning.       . fenofibrate (TRICOR) 48 MG tablet Take 48 mg by mouth every morning.      . ferrous sulfate 325 (65 FE) MG tablet Take 325 mg by mouth 2 (two) times daily.        . fluocinonide cream (LIDEX) 0.05 % Apply 1 application topically 2 (two) times daily.      . furosemide (LASIX) 20 MG tablet Take 1 tablet by mouth daily.      . hydrochlorothiazide (HYDRODIURIL) 25 MG tablet Take 12.5 mg by mouth every morning.       . loratadine (CLARITIN) 10 MG tablet Take 10 mg by mouth daily.      . miconazole (MICOTIN) 2 % cream Apply 1 application topically as needed (burning). Apply to buttocks.      . Multiple Vitamins-Minerals (HCA SUPER THERAVITE-M PO) Take 1 tablet by mouth every morning.      . nystatin cream (MYCOSTATIN) Apply 1 application topically 2 (two) times daily. Apply to genital area      . potassium chloride SA (K-DUR,KLOR-CON) 20 MEQ tablet Take 20 mEq by mouth every morning.       . psyllium (REGULOID) 0.52 G capsule Take 0.52 g by mouth every morning.       Marland Kitchen QUEtiapine (SEROQUEL) 50 MG tablet Take 1 tablet (50 mg total) by mouth at bedtime.      . Skin Protectants, Misc. (BAZA PROTECT EX) Apply topically as needed.       Review Of Systems: 12 point ROS negative except as noted above in HPI.  Physical Exam: Filed Vitals:   05/27/14 0020  BP: 133/74  Pulse: 61  Temp:   Resp: 17    General: somnolent, snoring, minimally responsive to sternal rubbing  HEENT: extra ocular movement intact and + eye, nasal, oral crusting  Heart: S1, S2 normal, no murmur, rub or gallop, regular rate and rhythm Lungs: clear to auscultation, no wheezes or rales and unlabored breathing Abdomen: abdomen is  soft without significant tenderness, masses, organomegaly or guarding Extremities: extremities normal, atraumatic, no cyanosis or edema Skin:no rashes, no ecchymoses Neurology: uncooperative to exam, no focal deficits observed   Labs and Imaging: Lab Results  Component Value Date/Time   NA 142 05/26/2014 10:51 PM   K 3.6* 05/26/2014 10:51 PM   CL 103 05/26/2014 10:51 PM   CO2 26 05/26/2014 10:51 PM   BUN 21 05/26/2014 10:51 PM   CREATININE 0.72 05/26/2014 10:51 PM   GLUCOSE 102* 05/26/2014 10:51 PM   Lab Results  Component Value Date   WBC 12.2* 05/26/2014   HGB 13.5 05/26/2014   HCT 39.5 05/26/2014   MCV  88.8 05/26/2014   PLT 224 05/26/2014   Urinalysis    Component Value Date/Time   COLORURINE AMBER* 05/26/2014 2127   APPEARANCEUR TURBID* 05/26/2014 2127   LABSPEC 1.021 05/26/2014 2127   PHURINE 8.5* 05/26/2014 2127   GLUCOSEU NEGATIVE 05/26/2014 2127   HGBUR SMALL* 05/26/2014 2127   BILIRUBINUR NEGATIVE 05/26/2014 2127   KETONESUR NEGATIVE 05/26/2014 2127   PROTEINUR 100* 05/26/2014 2127   UROBILINOGEN 1.0 05/26/2014 2127   NITRITE NEGATIVE 05/26/2014 2127   LEUKOCYTESUR LARGE* 05/26/2014 2127       Dg Chest 1 View  05/26/2014   CLINICAL DATA:  Fever, altered mental status  EXAM: CHEST - 1 VIEW  COMPARISON:  03/19/2013  FINDINGS: Low lung volumes persist with central vascular congestion and diffuse prominence of the interstitial markings but no focal acute finding. The cardiomegaly again noted. Bilateral glenohumeral joint and AC joint degenerative change.  IMPRESSION: Stable findings as above.  No new acute focal finding.   Electronically Signed   By: Christiana Pellant M.D.   On: 05/26/2014 22:49           Doree Albee MD  Pager: [phone]

## 2014-05-28 ENCOUNTER — Inpatient Hospital Stay (HOSPITAL_COMMUNITY): Payer: Medicare Other

## 2014-05-28 DIAGNOSIS — N390 Urinary tract infection, site not specified: Secondary | ICD-10-CM | POA: Diagnosis not present

## 2014-05-28 LAB — COMPREHENSIVE METABOLIC PANEL
ALBUMIN: 3.1 g/dL — AB (ref 3.5–5.2)
ALT: 11 U/L (ref 0–53)
ANION GAP: 11 (ref 5–15)
AST: 13 U/L (ref 0–37)
Alkaline Phosphatase: 52 U/L (ref 39–117)
BUN: 15 mg/dL (ref 6–23)
CO2: 26 mEq/L (ref 19–32)
Calcium: 8.8 mg/dL (ref 8.4–10.5)
Chloride: 105 mEq/L (ref 96–112)
Creatinine, Ser: 0.65 mg/dL (ref 0.50–1.35)
GFR calc Af Amer: >90 mL/min (ref >90)
GFR calc non Af Amer: >90 mL/min (ref >90)
Glucose, Bld: 93 mg/dL (ref 70–99)
Potassium: 4.4 mEq/L (ref 3.7–5.3)
Sodium: 142 mEq/L (ref 137–147)
TOTAL PROTEIN: 6.1 g/dL (ref 6.0–8.3)
Total Bilirubin: 0.4 mg/dL (ref 0.3–1.2)

## 2014-05-28 LAB — CBC WITH DIFFERENTIAL/PLATELET
BASOS ABS: 0 10*3/uL (ref 0.0–0.1)
Basophils Relative: 0 % (ref 0–1)
Eosinophils Absolute: 0.4 10*3/uL (ref 0.0–0.7)
Eosinophils Relative: 5 % (ref 0–5)
HCT: 39 % (ref 39.0–52.0)
HEMOGLOBIN: 13 g/dL (ref 13.0–17.0)
LYMPHS PCT: 14 % (ref 12–46)
Lymphs Abs: 1 10*3/uL (ref 0.7–4.0)
MCH: 30 pg (ref 26.0–34.0)
MCHC: 33.3 g/dL (ref 30.0–36.0)
MCV: 90.1 fL (ref 78.0–100.0)
MONOS PCT: 8 % (ref 3–12)
Monocytes Absolute: 0.6 10*3/uL (ref 0.1–1.0)
NEUTROS ABS: 5.7 10*3/uL (ref 1.7–7.7)
Neutrophils Relative %: 73 % (ref 43–77)
Platelets: 199 10*3/uL (ref 150–400)
RBC: 4.33 MIL/uL (ref 4.22–5.81)
RDW: 12.3 % (ref 11.5–15.5)
WBC: 7.7 10*3/uL (ref 4.0–10.5)

## 2014-05-28 MED ORDER — AZITHROMYCIN 500 MG PO TABS
500.0000 mg | ORAL_TABLET | Freq: Every day | ORAL | Status: DC
  Filled 2014-05-28: qty 1

## 2014-05-28 MED ORDER — INFLUENZA VAC SPLIT QUAD 0.5 ML IM SUSY
0.5000 mL | PREFILLED_SYRINGE | INTRAMUSCULAR | Status: AC
  Administered 2014-05-28: 0.5 mL via INTRAMUSCULAR
  Filled 2014-05-28: qty 0.5

## 2014-05-28 MED ORDER — LORAZEPAM 2 MG/ML IJ SOLN
INTRAMUSCULAR | Status: AC
  Filled 2014-05-28: qty 1

## 2014-05-28 MED ORDER — LORAZEPAM 2 MG/ML IJ SOLN
1.0000 mg | Freq: Once | INTRAMUSCULAR | Status: AC
  Administered 2014-05-28: 1 mg via INTRAVENOUS

## 2014-05-28 NOTE — Progress Notes (Signed)
Nutrition Brief Note  Patient identified on the Malnutrition Screening Tool (MST) Report and </= 12 Braden score report.  Per weight readings below, patient's weight has been stable.  No wound or skin breakdown noted.  Wt Readings from Last 15 Encounters:  05/28/14 229 lb 8 oz (104.1 kg)  03/20/13 227 lb 11.8 oz (103.3 kg)  05/28/12 224 lb 10.4 oz (101.9 kg)    Body mass index is 32.93 kg/(m^2). Patient meets criteria for Obesity Class I based on current BMI.   Current diet order is Soft, patient is consuming approximately 100% of meals at this time. Labs and medications reviewed.   No nutrition interventions warranted at this time. If nutrition issues arise, please consult RD.   Maureen Chatters, RD, LDN Pager #: [phone] After-Hours Pager #: [phone]

## 2014-05-28 NOTE — Care Management Note (Signed)
  Page 1 of 1   06/02/2014     11:51:52 AM CARE MANAGEMENT NOTE 06/02/2014  Patient:  Joseph [name], Joseph [name]   Account Number:  [id number]  Date Initiated:  05/28/2014  Documentation initiated by:  Ronny Flurry  Subjective/Objective Assessment:     Action/Plan:   Anticipated DC Date:  06/02/2014   Anticipated DC Plan:  ASSISTED LIVING / REST HOME         Choice offered to / List presented to:             Status of service:   Medicare Important Message given?  YES (If response is "NO", the following Medicare IM given date fields will be blank) Date Medicare IM given:  05/28/2014 Medicare IM given by:  Ronny Flurry Date Additional Medicare IM given:  05/31/2014 Additional Medicare IM given by:  Ronny Flurry  Discharge Disposition:    Per UR Regulation:    If discussed at Long Length of Stay Meetings, dates discussed:   06/01/2014    Comments:  06-02-14 Spoke to Powell at [facility] , phone [phone], fax [phone] , [facility] can take patient back today , would like home health RN/PT/OT ordered , same done . Annice Pih will set home health up . Ronny Flurry RN BSN [phone]   05-28-14 Copy IM left in patient's room. Ronny Flurry RN BSN

## 2014-05-28 NOTE — Progress Notes (Signed)
[facility] PROGRESS NOTE  Joseph [name] HQI:[id number] DOB: [birth year] DOA: 05/26/2014 PCP: Florentina Jenny, MD  Assessment/Plan: Active Problems:   Encephalopathy - Most likely secondary to infectious etiology. Resolving on IV antibiotics - Will discontinue azithromycin and I suspect that infectious source most likely secondary to active UTI    UTI (lower urinary tract infection) - Urine culture growing Proteus mirabilis, sensitivities pending - Awaiting report results. We'll continue third-generation cephalosporin at this juncture  Positive blood culture - 1 of 2 positive. - Will await cultures and sensitivities to see if this represents contamination - Agree with covering with vancomycin at this juncture  Code Status: full Family Communication: Discussed with son over the phone Disposition Plan: Pending results of recent blood culture   Consultants:  None  Procedures:  None  Antibiotics:  Rocephin  Vancomycin  HPI/Subjective: Pt more alert.  No new complaints reported to me.  Objective: Filed Vitals:   05/28/14 1332  BP: 133/51  Pulse: 56  Temp: 97.8 F (36.6 C)  Resp: 17    Intake/Output Summary (Last 24 hours) at 05/28/14 1443 Last data filed at 05/28/14 1336  Gross per 24 hour  Intake   1586 ml  Output    650 ml  Net    936 ml   Filed Weights   05/26/14 2059 05/27/14 0144 05/28/14 0617  Weight: 102.967 kg (227 lb) 104.599 kg (230 lb 9.6 oz) 104.1 kg (229 lb 8 oz)    Exam:   General:  Pt in nad, alert and awake  Cardiovascular: rrr, no mrg  Respiratory: cta bl, no wheezes  Abdomen: soft, ND  Skin: No obvious rashes on limited exam.  Data Reviewed: Basic Metabolic Panel:  Recent Labs Lab 05/26/14 2251 05/27/14 0150 05/28/14 0435  NA 142 140 142  K 3.6* 3.7 4.4  CL 103 102 105  CO2 GLUCOSE 102* 119* 93  BUN CREATININE 0.72  0.75 0.73 0.65  CALCIUM 8.7 8.8 8.8  MG 2.1  --   --    Liver Function  Tests:  Recent Labs Lab 05/26/14 2105 05/27/14 0150 05/28/14 0435  AST ALT ALKPHOS 61 50 52  BILITOT 0.6 0.6 0.4  PROT 7.6 6.3 6.1  ALBUMIN 3.8 3.2* 3.1*   No results found for this basename: LIPASE, AMYLASE,  in the last 168 hours  Recent Labs Lab 05/27/14 0126  AMMONIA 45   CBC:  Recent Labs Lab 05/26/14 2105 05/27/14 0059 05/27/14 0150 05/28/14 0435  WBC 12.2* 10.1 9.7 7.7  NEUTROABS 9.7*  --  7.2 5.7  HGB 13.5 12.9* 12.6* 13.0  HCT 39.5 37.7* 36.8* 39.0  MCV 88.8 90.2 88.7 90.1  PLT 224 213 203 199   Cardiac Enzymes: No results found for this basename: CKTOTAL, CKMB, CKMBINDEX, TROPONINI,  in the last 168 hours BNP (last 3 results)  Recent Labs  05/26/14 2105  PROBNP 354.0*   CBG:  Recent Labs Lab 05/26/14 2110  GLUCAP 102*    Recent Results (from the past 240 hour(s))  CULTURE, BLOOD (ROUTINE X 2)     Status: None   Collection Time    05/26/14  9:10 PM      Result Value Ref Range Status   Specimen Description BLOOD RIGHT ARM   Final   Special Requests BOTTLES DRAWN AEROBIC AND ANAEROBIC [facility] EACH   Final   Culture  Setup Time     Final  Value: 05/27/2014 04:19     Performed at [facility]   Culture     Final   Value: GRAM POSITIVE COCCI IN CLUSTERS     Note: Gram Stain Report Called to,Read Back By and Verified With: LOURDES ABIERA ON 05/27/2014 AT 11:17P BY WILEJ     Performed at [facility]   Report Status PENDING   Incomplete  URINE CULTURE     Status: None   Collection Time    05/26/14  9:27 PM      Result Value Ref Range Status   Specimen Description URINE, RANDOM   Final   Special Requests NONE   Final   Culture  Setup Time     Final   Value: 05/27/2014 09:16     Performed at [facility] Count     Final   Value: >=100,000 COLONIES/ML     Performed at [facility]   Culture     Final   Value: PROTEUS MIRABILIS     Performed at [facility]   Report Status  PENDING   Incomplete  CULTURE, BLOOD (ROUTINE X 2)     Status: None   Collection Time    05/26/14  9:36 PM      Result Value Ref Range Status   Specimen Description BLOOD RIGHT HAND   Final   Special Requests BOTTLES DRAWN AEROBIC AND ANAEROBIC 5CC   Final   Culture  Setup Time     Final   Value: 05/27/2014 04:17     Performed at [facility]   Culture     Final   Value:        BLOOD CULTURE RECEIVED NO GROWTH TO DATE CULTURE WILL BE HELD FOR 5 DAYS BEFORE ISSUING A FINAL NEGATIVE REPORT     Performed at [facility]   Report Status PENDING   Incomplete  MRSA PCR SCREENING     Status: None   Collection Time    05/27/14  2:13 AM      Result Value Ref Range Status   MRSA by PCR NEGATIVE  NEGATIVE Final   Comment:            The GeneXpert MRSA Assay (FDA     approved for NASAL specimens     only), is one component of a     comprehensive MRSA colonization     surveillance program. It is not     intended to diagnose MRSA     infection nor to guide or     monitor treatment for     MRSA infections.     Studies: Dg Chest 1 View  05/26/2014   CLINICAL DATA:  Fever, altered mental status  EXAM: CHEST - 1 VIEW  COMPARISON:  03/19/2013  FINDINGS: Low lung volumes persist with central vascular congestion and diffuse prominence of the interstitial markings but no focal acute finding. The cardiomegaly again noted. Bilateral glenohumeral joint and AC joint degenerative change.  IMPRESSION: Stable findings as above.  No new acute focal finding.   Electronically Signed   By: Christiana Pellant M.D.   On: 05/26/2014 22:49   Dg Chest 2 View  05/28/2014   CLINICAL DATA:  Encephalopathy, fever, altered mental status  EXAM: CHEST  2 VIEW  COMPARISON:  05/26/2014  FINDINGS: The patient is rotated to the left. Cardiomegaly persists with central vascular congestion slightly improved since previously. No new pulmonary opacity allowing for hypoaeration. Small  pleural effusions are noted.   IMPRESSION: Improving vascular congestion with persistent trace pleural effusions possibly in the setting of improving interstitial edema.   Electronically Signed   By: Christiana Pellant M.D.   On: 05/28/2014 08:49    Scheduled Meds: . antiseptic oral rinse  7 mL Mouth Rinse q12n4p  . azithromycin  500 mg Oral QHS  . cefTRIAXone (ROCEPHIN)  IV  1 g Intravenous Q24H  . chlorhexidine  15 mL Mouth Rinse BID  . heparin  5,000 Units Subcutaneous 3 times per day  . vancomycin  1,250 mg Intravenous Q12H   Continuous Infusions:    Time spent: > 35 minutes    Penny Pia  [facility] Pager [phone] If 7PM-7AM, please contact night-coverage at [email or web address], password [facility] 05/28/2014, 2:43 PM  LOS: 2 days

## 2014-05-28 NOTE — Clinical Social Work Note (Signed)
CSW spoke with doctor about discharge disposition and was informed that patient can not discharge today.  CSW spoke with [facility] who stated that patient would be unable to discharge to them over the weekend.  CSW spoke with patients son and informed that patient would not be discharging today, son stated he would like to speak with the doctor about the patients medical path.  CSW informed nurse who will facilitate call between doctor and son during rounds.    CSW will continue to follow.  Merlyn Lot, LCSWA Clinical Social Worker [phone]

## 2014-05-29 LAB — CBC WITH DIFFERENTIAL/PLATELET
BASOS ABS: 0 10*3/uL (ref 0.0–0.1)
BASOS PCT: 0 % (ref 0–1)
Eosinophils Absolute: 0.3 10*3/uL (ref 0.0–0.7)
Eosinophils Relative: 4 % (ref 0–5)
HCT: 38.2 % — ABNORMAL LOW (ref 39.0–52.0)
Hemoglobin: 13.1 g/dL (ref 13.0–17.0)
LYMPHS PCT: 14 % (ref 12–46)
Lymphs Abs: 1 10*3/uL (ref 0.7–4.0)
MCH: 30 pg (ref 26.0–34.0)
MCHC: 34.3 g/dL (ref 30.0–36.0)
MCV: 87.6 fL (ref 78.0–100.0)
Monocytes Absolute: 0.8 10*3/uL (ref 0.1–1.0)
Monocytes Relative: 11 % (ref 3–12)
Neutro Abs: 5 10*3/uL (ref 1.7–7.7)
Neutrophils Relative %: 71 % (ref 43–77)
Platelets: 218 10*3/uL (ref 150–400)
RBC: 4.36 MIL/uL (ref 4.22–5.81)
RDW: 12.2 % (ref 11.5–15.5)
WBC: 7.1 10*3/uL (ref 4.0–10.5)

## 2014-05-29 LAB — URINE CULTURE: Colony Count: >=100000

## 2014-05-29 LAB — COMPREHENSIVE METABOLIC PANEL
ALT: 15 U/L (ref 0–53)
AST: 17 U/L (ref 0–37)
Albumin: 3.1 g/dL — ABNORMAL LOW (ref 3.5–5.2)
Alkaline Phosphatase: 52 U/L (ref 39–117)
Anion gap: 14 (ref 5–15)
BILIRUBIN TOTAL: 0.3 mg/dL (ref 0.3–1.2)
BUN: 14 mg/dL (ref 6–23)
CHLORIDE: 104 meq/L (ref 96–112)
CO2: 23 meq/L (ref 19–32)
Calcium: 8.9 mg/dL (ref 8.4–10.5)
Creatinine, Ser: 0.64 mg/dL (ref 0.50–1.35)
GFR calc Af Amer: >90 mL/min (ref >90)
Glucose, Bld: 95 mg/dL (ref 70–99)
Potassium: 3.9 mEq/L (ref 3.7–5.3)
Sodium: 141 mEq/L (ref 137–147)
Total Protein: 6.4 g/dL (ref 6.0–8.3)

## 2014-05-29 MED ORDER — ESCITALOPRAM OXALATE 20 MG PO TABS
20.0000 mg | ORAL_TABLET | Freq: Every morning | ORAL | Status: DC
  Administered 2014-05-30 – 2014-06-02 (×4): 20 mg via ORAL
  Filled 2014-05-29 (×4): qty 1

## 2014-05-29 MED ORDER — CIPROFLOXACIN IN D5W 400 MG/200ML IV SOLN
400.0000 mg | Freq: Two times a day (BID) | INTRAVENOUS | Status: DC
  Administered 2014-05-29 – 2014-05-31 (×4): 400 mg via INTRAVENOUS
  Filled 2014-05-29 (×5): qty 200

## 2014-05-29 MED ORDER — DONEPEZIL HCL 5 MG PO TABS
5.0000 mg | ORAL_TABLET | Freq: Every day | ORAL | Status: DC
  Administered 2014-05-29 – 2014-06-01 (×4): 5 mg via ORAL
  Filled 2014-05-29 (×5): qty 1

## 2014-05-29 MED ORDER — QUETIAPINE FUMARATE 50 MG PO TABS
50.0000 mg | ORAL_TABLET | Freq: Every day | ORAL | Status: DC
  Administered 2014-05-29 – 2014-06-01 (×4): 50 mg via ORAL
  Filled 2014-05-29 (×6): qty 1

## 2014-05-29 MED ORDER — CARBIDOPA-LEVODOPA 25-100 MG PO TABS
1.5000 | ORAL_TABLET | ORAL | Status: DC
  Administered 2014-05-30 – 2014-06-02 (×10): 1.5 via ORAL
  Filled 2014-05-29 (×11): qty 1.5

## 2014-05-29 NOTE — Progress Notes (Signed)
Noted rash on pt's back, flat red spots from shoulder blades to waist, md notified, new order.

## 2014-05-29 NOTE — ED Provider Notes (Signed)
I saw and evaluated the patient, reviewed the resident's note and I agree with the findings and plan.   .Face to face Exam:  General:  Awake HEENT:  Atraumatic Resp:  Normal effort Abd:  Nondistended Neuro:No focal weakness   Nelia Shi, MD 05/29/14 [phone]

## 2014-05-29 NOTE — Progress Notes (Signed)
[facility] PROGRESS NOTE  Joseph [name] ZOX:[id number] DOB: 04/05/1941 DOA: 05/26/2014 PCP: Florentina Jenny, MD  Assessment/Plan: Active Problems:   Encephalopathy - Most likely secondary to infectious etiology. Resolving on IV antibiotics - Will discontinue azithromycin and I suspect that infectious source most likely secondary to active UTI - Pt reportedly developing rash on Rocephin.  Will discontinue and place on cipro    UTI (lower urinary tract infection) - Urine culture growing Proteus mirabilis, sensitivities pending - Awaiting report results. We'll continue third-generation cephalosporin at this juncture  Positive blood culture - 1 of 2 positive. - Will await cultures and sensitivities to see if this represents contamination - Agree with covering with vancomycin at this juncture  Code Status: full Family Communication: Discussed with son over the phone Disposition Plan: Pending results of recent blood culture. Heart rate and BP's steady within the last 24 hours. Will d/c telemetry.   Consultants:  None  Procedures:  None  Antibiotics:  Rocephin  Vancomycin  HPI/Subjective: Pt more alert.  No new complaints reported to me.  Objective: Filed Vitals:   05/29/14 0904  BP: 143/96  Pulse: 61  Temp: 98.1 F (36.7 C)  Resp: 18    Intake/Output Summary (Last 24 hours) at 05/29/14 1320 Last data filed at 05/29/14 0911  Gross per 24 hour  Intake   1100 ml  Output   1000 ml  Net    100 ml   Filed Weights   05/28/14 0617 05/29/14 0500 05/29/14 0904  Weight: 104.1 kg (229 lb 8 oz) 105.3 kg (232 lb 2.3 oz) 105.4 kg (232 lb 5.8 oz)    Exam:   General:  Pt in nad, alert and awake  Cardiovascular: rrr, no mrg  Respiratory: cta bl, no wheezes  Abdomen: soft, ND  Data Reviewed: Basic Metabolic Panel:  Recent Labs Lab 05/26/14 2251 05/27/14 0150 05/28/14 0435 05/29/14 0438  NA 142 140 142 141  K 3.6* 3.7 4.4 3.9  CL 103 102 105 104  CO2 GLUCOSE 102* 119* 93 95  BUN CREATININE 0.72  0.75 0.73 0.65 0.64  CALCIUM 8.7 8.8 8.8 8.9  MG 2.1  --   --   --    Liver Function Tests:  Recent Labs Lab 05/26/14 2105 05/27/14 0150 05/28/14 0435 05/29/14 0438  AST ALT ALKPHOS 61 50 52 52  BILITOT 0.6 0.6 0.4 0.3  PROT 7.6 6.3 6.1 6.4  ALBUMIN 3.8 3.2* 3.1* 3.1*   No results found for this basename: LIPASE, AMYLASE,  in the last 168 hours  Recent Labs Lab 05/27/14 0126  AMMONIA 45   CBC:  Recent Labs Lab 05/26/14 2105 05/27/14 0059 05/27/14 0150 05/28/14 0435 05/29/14 0438  WBC 12.2* 10.1 9.7 7.7 7.1  NEUTROABS 9.7*  --  7.2 5.7 5.0  HGB 13.5 12.9* 12.6* 13.0 13.1  HCT 39.5 37.7* 36.8* 39.0 38.2*  MCV 88.8 90.2 88.7 90.1 87.6  PLT 224 213 203 199 218   Cardiac Enzymes: No results found for this basename: CKTOTAL, CKMB, CKMBINDEX, TROPONINI,  in the last 168 hours BNP (last 3 results)  Recent Labs  05/26/14 2105  PROBNP 354.0*   CBG:  Recent Labs Lab 05/26/14 2110  GLUCAP 102*    Recent Results (from the past 240 hour(s))  CULTURE, BLOOD (ROUTINE X 2)     Status: None   Collection Time  05/26/14  9:10 PM      Result Value Ref Range Status   Specimen Description BLOOD RIGHT ARM   Final   Special Requests BOTTLES DRAWN AEROBIC AND ANAEROBIC [facility] EACH   Final   Culture  Setup Time     Final   Value: 05/27/2014 04:19     Performed at [facility]   Culture     Final   Value: GRAM POSITIVE COCCI IN CLUSTERS     Note: Gram Stain Report Called to,Read Back By and Verified With: LOURDES ABIERA ON 05/27/2014 AT 11:17P BY WILEJ     Performed at [facility]   Report Status PENDING   Incomplete  URINE CULTURE     Status: None   Collection Time    05/26/14  9:27 PM      Result Value Ref Range Status   Specimen Description URINE, RANDOM   Final   Special Requests NONE   Final   Culture  Setup Time     Final   Value: 05/27/2014 09:16      Performed at [facility] Count     Final   Value: >=100,000 COLONIES/ML     Performed at [facility]   Culture     Final   Value: PROTEUS MIRABILIS     Performed at [facility]   Report Status PENDING   Incomplete  CULTURE, BLOOD (ROUTINE X 2)     Status: None   Collection Time    05/26/14  9:36 PM      Result Value Ref Range Status   Specimen Description BLOOD RIGHT HAND   Final   Special Requests BOTTLES DRAWN AEROBIC AND ANAEROBIC 5CC   Final   Culture  Setup Time     Final   Value: 05/27/2014 04:17     Performed at [facility]   Culture     Final   Value:        BLOOD CULTURE RECEIVED NO GROWTH TO DATE CULTURE WILL BE HELD FOR 5 DAYS BEFORE ISSUING A FINAL NEGATIVE REPORT     Performed at [facility]   Report Status PENDING   Incomplete  MRSA PCR SCREENING     Status: None   Collection Time    05/27/14  2:13 AM      Result Value Ref Range Status   MRSA by PCR NEGATIVE  NEGATIVE Final   Comment:            The GeneXpert MRSA Assay (FDA     approved for NASAL specimens     only), is one component of a     comprehensive MRSA colonization     surveillance program. It is not     intended to diagnose MRSA     infection nor to guide or     monitor treatment for     MRSA infections.     Studies: Dg Chest 2 View  05/28/2014   CLINICAL DATA:  Encephalopathy, fever, altered mental status  EXAM: CHEST  2 VIEW  COMPARISON:  05/26/2014  FINDINGS: The patient is rotated to the left. Cardiomegaly persists with central vascular congestion slightly improved since previously. No new pulmonary opacity allowing for hypoaeration. Small pleural effusions are noted.  IMPRESSION: Improving vascular congestion with persistent trace pleural effusions possibly in the setting of improving interstitial edema.   Electronically Signed   By: Christiana Pellant M.D.   On: 05/28/2014 08:49  Scheduled Meds: . antiseptic oral rinse  7 mL Mouth  Rinse q12n4p  . chlorhexidine  15 mL Mouth Rinse BID  . heparin  5,000 Units Subcutaneous 3 times per day  . vancomycin  1,250 mg Intravenous Q12H   Continuous Infusions:    Time spent: > 35 minutes    Penny Pia  [facility] Pager [phone] If 7PM-7AM, please contact night-coverage at [email or web address], password [facility] 05/29/2014, 1:20 PM  LOS: 3 days

## 2014-05-29 NOTE — Progress Notes (Signed)
ANTIBIOTIC CONSULT NOTE - INITIAL  Pharmacy Consult for cipro Indication: UTI  No Known Allergies  Patient Measurements: Height:  (177.8 cm) Weight: 232 lb 5.8 oz (105.4 kg) IBW/kg (Calculated) : 73   Vital Signs: Temp: 98.1 F (36.7 C) (09/19 0904) Temp src: Oral (09/19 0904) BP: 143/96 mmHg (09/19 0904) Pulse Rate: 61 (09/19 0904) Intake/Output from previous day: 09/18 0701 - 09/19 0700 In: 1496 [P.O.:1246; IV Piggyback:250] Out: 1000 [Urine:1000] Intake/Output from this shift: Total I/O In: 240 [P.O.:240] Out: 0   Labs:  Recent Labs  05/27/14 0150 05/28/14 0435 05/29/14 0438  WBC 9.7 7.7 7.1  HGB 12.6* 13.0 13.1  PLT 203 199 218  CREATININE 0.73 0.65 0.64   Estimated Creatinine Clearance: 98.5 ml/min (by C-G formula based on Cr of 0.64). No results found for this basename: VANCOTROUGH, Leodis Binet, VANCORANDOM, GENTTROUGH, GENTPEAK, GENTRANDOM, TOBRATROUGH, TOBRAPEAK, TOBRARND, AMIKACINPEAK, AMIKACINTROU, AMIKACIN,  in the last 72 hours   Microbiology: Recent Results (from the past 720 hour(s))  CULTURE, BLOOD (ROUTINE X 2)     Status: None   Collection Time    05/26/14  9:10 PM      Result Value Ref Range Status   Specimen Description BLOOD RIGHT ARM   Final   Special Requests BOTTLES DRAWN AEROBIC AND ANAEROBIC [facility] EACH   Final   Culture  Setup Time     Final   Value: 05/27/2014 04:19     Performed at [facility]   Culture     Final   Value: GRAM POSITIVE COCCI IN CLUSTERS     Note: Gram Stain Report Called to,Read Back By and Verified With: LOURDES ABIERA ON 05/27/2014 AT 11:17P BY WILEJ     Performed at [facility]   Report Status PENDING   Incomplete  URINE CULTURE     Status: None   Collection Time    05/26/14  9:27 PM      Result Value Ref Range Status   Specimen Description URINE, RANDOM   Final   Special Requests NONE   Final   Culture  Setup Time     Final   Value: 05/27/2014 09:16     Performed at [facility] Count     Final   Value: >=100,000 COLONIES/ML     Performed at [facility]   Culture     Final   Value: PROTEUS MIRABILIS     Performed at [facility]   Report Status PENDING   Incomplete  CULTURE, BLOOD (ROUTINE X 2)     Status: None   Collection Time    05/26/14  9:36 PM      Result Value Ref Range Status   Specimen Description BLOOD RIGHT HAND   Final   Special Requests BOTTLES DRAWN AEROBIC AND ANAEROBIC 5CC   Final   Culture  Setup Time     Final   Value: 05/27/2014 04:17     Performed at [facility]   Culture     Final   Value:        BLOOD CULTURE RECEIVED NO GROWTH TO DATE CULTURE WILL BE HELD FOR 5 DAYS BEFORE ISSUING A FINAL NEGATIVE REPORT     Performed at [facility]   Report Status PENDING   Incomplete  MRSA PCR SCREENING     Status: None   Collection Time    05/27/14  2:13 AM      Result Value Ref  Range Status   MRSA by PCR NEGATIVE  NEGATIVE Final   Comment:            The GeneXpert MRSA Assay (FDA     approved for NASAL specimens     only), is one component of a     comprehensive MRSA colonization     surveillance program. It is not     intended to diagnose MRSA     infection nor to guide or     monitor treatment for     MRSA infections.    Medical History: Past Medical History  Diagnosis Date  . Parkinson disease   . Seizures   . Depression   . Hypertension   . Dementia   . UTI (lower urinary tract infection)     Medications:  Prescriptions prior to admission  Medication Sig Dispense Refill  . carbidopa-levodopa (SINEMET IR) 25-100 MG per tablet Take 1.5 tablets by mouth 3 (three) times daily. At 5am, 10am, 4pm  135 tablet  1  . docusate sodium (COLACE) 100 MG capsule Take 100 mg by mouth every morning.       . donepezil (ARICEPT) 5 MG tablet Take 5 mg by mouth at bedtime.      Marland Kitchen escitalopram (LEXAPRO) 20 MG tablet Take 20 mg by mouth every morning.       . fluocinonide cream (LIDEX) 0.05 %  Apply 1 application topically daily.       . furosemide (LASIX) 20 MG tablet Take 1 tablet by mouth daily.      . hydrochlorothiazide (HYDRODIURIL) 25 MG tablet Take 12.5 mg by mouth every morning.       . loratadine (CLARITIN) 10 MG tablet Take 10 mg by mouth daily as needed for allergies.       Marland Kitchen LORazepam (ATIVAN) 0.5 MG tablet Take 0.25 mg by mouth 2 (two) times daily as needed for anxiety.      . miconazole (MICOTIN) 2 % cream Apply 1 application topically daily as needed (burning). Apply to buttocks.      . nystatin cream (MYCOSTATIN) Apply 1 application topically 2 (two) times daily. Apply to genital area      . potassium chloride SA (K-DUR,KLOR-CON) 20 MEQ tablet Take 20 mEq by mouth every morning.       . psyllium (REGULOID) 0.52 G capsule Take 0.52 g by mouth every morning.       Marland Kitchen QUEtiapine (SEROQUEL) 50 MG tablet Take 1 tablet (50 mg total) by mouth at bedtime.      . Skin Protectants, Misc. (BAZA PROTECT EX) Apply 1 application topically daily as needed (burning).        Assessment: 73 yo man with proteus UTI.  He developed rash and ceftriaxone changed to cipro.  Goal of Therapy:  eradication of infection  Plan:  Cipro 400 mg IV q12 hours F/u sensitivities proteus.  Thanks for allowing pharmacy to be a part of this patient's care.  Talbert Cage, PharmD Clinical Pharmacist, [phone] 05/29/2014,1:41 PM

## 2014-05-30 LAB — CBC WITH DIFFERENTIAL/PLATELET
Basophils Absolute: 0 10*3/uL (ref 0.0–0.1)
Basophils Relative: 0 % (ref 0–1)
EOS PCT: 5 % (ref 0–5)
Eosinophils Absolute: 0.4 10*3/uL (ref 0.0–0.7)
HCT: 38 % — ABNORMAL LOW (ref 39.0–52.0)
HEMOGLOBIN: 12.8 g/dL — AB (ref 13.0–17.0)
LYMPHS ABS: 1.1 10*3/uL (ref 0.7–4.0)
LYMPHS PCT: 16 % (ref 12–46)
MCH: 29.8 pg (ref 26.0–34.0)
MCHC: 33.7 g/dL (ref 30.0–36.0)
MCV: 88.4 fL (ref 78.0–100.0)
MONOS PCT: 12 % (ref 3–12)
Monocytes Absolute: 0.9 10*3/uL (ref 0.1–1.0)
Neutro Abs: 4.8 10*3/uL (ref 1.7–7.7)
Neutrophils Relative %: 67 % (ref 43–77)
Platelets: 224 10*3/uL (ref 150–400)
RBC: 4.3 MIL/uL (ref 4.22–5.81)
RDW: 12.4 % (ref 11.5–15.5)
WBC: 7.2 10*3/uL (ref 4.0–10.5)

## 2014-05-30 LAB — COMPREHENSIVE METABOLIC PANEL
ALBUMIN: 3 g/dL — AB (ref 3.5–5.2)
ALT: 15 U/L (ref 0–53)
AST: 17 U/L (ref 0–37)
Alkaline Phosphatase: 50 U/L (ref 39–117)
Anion gap: 11 (ref 5–15)
BUN: 12 mg/dL (ref 6–23)
CO2: 24 mEq/L (ref 19–32)
Calcium: 8.5 mg/dL (ref 8.4–10.5)
Chloride: 107 mEq/L (ref 96–112)
Creatinine, Ser: 0.74 mg/dL (ref 0.50–1.35)
GFR calc Af Amer: >90 mL/min (ref >90)
GFR calc non Af Amer: 89 mL/min — ABNORMAL LOW (ref >90)
Glucose, Bld: 94 mg/dL (ref 70–99)
Potassium: 4 mEq/L (ref 3.7–5.3)
SODIUM: 142 meq/L (ref 137–147)
TOTAL PROTEIN: 6.1 g/dL (ref 6.0–8.3)
Total Bilirubin: 0.5 mg/dL (ref 0.3–1.2)

## 2014-05-30 LAB — CULTURE, BLOOD (ROUTINE X 2)

## 2014-05-30 NOTE — Progress Notes (Signed)
Pt has been yelling out frequently this morning and at times mumbling incoherently. Pt disoriented to place, time, situation. Earlier this morning was also disoriented to self. Pt when coherent makes sexually inappropriate requests/comments.

## 2014-05-30 NOTE — Progress Notes (Signed)
[facility] PROGRESS NOTE  Joseph [name] AVW:[id number] DOB: 1940/09/25 DOA: 05/26/2014 PCP: Florentina Jenny, MD  Assessment/Plan: Active Problems:   Encephalopathy - Most likely secondary to infectious etiology. Resolving on IV antibiotics - Will discontinue azithromycin and I suspect that infectious source most likely secondary to active UTI - Pt reportedly developing rash on Rocephin.   - Continue cipro    UTI (lower urinary tract infection) - Urine culture growing Proteus mirabilis, sensitivities pending - Awaiting report results. We'll continue third-generation cephalosporin at this juncture - Day 5/7 of antibiotics  Positive blood culture - 1 of 2 positive. - Will await cultures and sensitivities to see if this represents contamination - Discussed with ID specialist and currently most likely cause of positive blood culture is contamination.  Will discontinue vancomycin  Code Status: full Family Communication: None at bedside. Disposition Plan: Pending results of recent blood culture. Heart rate and BP's steady within the last 24 hours. Will d/c telemetry.   Consultants:  None  Procedures:  None  Antibiotics:  Rocephin  Vancomycin  HPI/Subjective: Pt more alert.  No new complaints reported to me.  Objective: Filed Vitals:   05/30/14 1334  BP: 142/82  Pulse: 72  Temp: 98.2 F (36.8 C)  Resp: 18    Intake/Output Summary (Last 24 hours) at 05/30/14 1338 Last data filed at 05/30/14 1333  Gross per 24 hour  Intake    360 ml  Output   1100 ml  Net   -740 ml   Filed Weights   05/29/14 0500 05/29/14 0904 05/30/14 0522  Weight: 105.3 kg (232 lb 2.3 oz) 105.4 kg (232 lb 5.8 oz) 106.3 kg (234 lb 5.6 oz)    Exam:   General:  Pt in nad, alert and awake  Cardiovascular: rrr, no mrg  Respiratory: cta bl, no wheezes  Abdomen: soft, ND  Data Reviewed: Basic Metabolic Panel:  Recent Labs Lab 05/26/14 2251 05/27/14 0150 05/28/14 0435  05/29/14 0438 05/30/14 0530  NA 142 140 142 141 142  K 3.6* 3.7 4.4 3.9 4.0  CL 103 102 105 104 107  CO2 GLUCOSE 102* 119* 93 95 94  BUN CREATININE 0.72  0.75 0.73 0.65 0.64 0.74  CALCIUM 8.7 8.8 8.8 8.9 8.5  MG 2.1  --   --   --   --    Liver Function Tests:  Recent Labs Lab 05/26/14 2105 05/27/14 0150 05/28/14 0435 05/29/14 0438 05/30/14 0530  AST ALT ALKPHOS 61 50 52 52 50  BILITOT 0.6 0.6 0.4 0.3 0.5  PROT 7.6 6.3 6.1 6.4 6.1  ALBUMIN 3.8 3.2* 3.1* 3.1* 3.0*   No results found for this basename: LIPASE, AMYLASE,  in the last 168 hours  Recent Labs Lab 05/27/14 0126  AMMONIA 45   CBC:  Recent Labs Lab 05/26/14 2105 05/27/14 0059 05/27/14 0150 05/28/14 0435 05/29/14 0438 05/30/14 0530  WBC 12.2* 10.1 9.7 7.7 7.1 7.2  NEUTROABS 9.7*  --  7.2 5.7 5.0 4.8  HGB 13.5 12.9* 12.6* 13.0 13.1 12.8*  HCT 39.5 37.7* 36.8* 39.0 38.2* 38.0*  MCV 88.8 90.2 88.7 90.1 87.6 88.4  PLT 224 213 203 199 218 224   Cardiac Enzymes: No results found for this basename: CKTOTAL, CKMB, CKMBINDEX, TROPONINI,  in the last 168 hours BNP (last 3 results)  Recent Labs  05/26/14 2105  PROBNP 354.0*  CBG:  Recent Labs Lab 05/26/14 2110  GLUCAP 102*    Recent Results (from the past 240 hour(s))  CULTURE, BLOOD (ROUTINE X 2)     Status: None   Collection Time    05/26/14  9:10 PM      Result Value Ref Range Status   Specimen Description BLOOD RIGHT ARM   Final   Special Requests BOTTLES DRAWN AEROBIC AND ANAEROBIC [facility] EACH   Final   Culture  Setup Time     Final   Value: 05/27/2014 04:19     Performed at [facility]   Culture     Final   Value: STAPHYLOCOCCUS SPECIES (COAGULASE NEGATIVE)     Note: THE SIGNIFICANCE OF ISOLATING THIS ORGANISM FROM A SINGLE SET OF BLOOD CULTURES WHEN MULTIPLE SETS ARE DRAWN IS UNCERTAIN. PLEASE NOTIFY THE MICROBIOLOGY DEPARTMENT WITHIN ONE WEEK IF SPECIATION AND  SENSITIVITIES ARE REQUIRED.     Note: Gram Stain Report Called to,Read Back By and Verified With: LOURDES ABIERA ON 05/27/2014 AT 11:17P BY WILEJ     Performed at [facility]   Report Status 05/30/2014 FINAL   Final  URINE CULTURE     Status: None   Collection Time    05/26/14  9:27 PM      Result Value Ref Range Status   Specimen Description URINE, RANDOM   Final   Special Requests NONE   Final   Culture  Setup Time     Final   Value: 05/27/2014 09:16     Performed at [facility] Count     Final   Value: >=100,000 COLONIES/ML     Performed at [facility]   Culture     Final   Value: PROTEUS MIRABILIS     Performed at [facility]   Report Status 05/29/2014 FINAL   Final   Organism ID, Bacteria PROTEUS MIRABILIS   Final  CULTURE, BLOOD (ROUTINE X 2)     Status: None   Collection Time    05/26/14  9:36 PM      Result Value Ref Range Status   Specimen Description BLOOD RIGHT HAND   Final   Special Requests BOTTLES DRAWN AEROBIC AND ANAEROBIC 5CC   Final   Culture  Setup Time     Final   Value: 05/27/2014 04:17     Performed at [facility]   Culture     Final   Value:        BLOOD CULTURE RECEIVED NO GROWTH TO DATE CULTURE WILL BE HELD FOR 5 DAYS BEFORE ISSUING A FINAL NEGATIVE REPORT     Performed at [facility]   Report Status PENDING   Incomplete  MRSA PCR SCREENING     Status: None   Collection Time    05/27/14  2:13 AM      Result Value Ref Range Status   MRSA by PCR NEGATIVE  NEGATIVE Final   Comment:            The GeneXpert MRSA Assay (FDA     approved for NASAL specimens     only), is one component of a     comprehensive MRSA colonization     surveillance program. It is not     intended to diagnose MRSA     infection nor to guide or     monitor treatment for     MRSA infections.     Studies: No results  found.  Scheduled Meds: . antiseptic oral rinse  7 mL Mouth Rinse q12n4p  . carbidopa-levodopa   1.5 tablet Oral 3 times per day  . chlorhexidine  15 mL Mouth Rinse BID  . ciprofloxacin  400 mg Intravenous Q12H  . donepezil  5 mg Oral QHS  . escitalopram  20 mg Oral q morning - 10a  . heparin  5,000 Units Subcutaneous 3 times per day  . QUEtiapine  50 mg Oral QHS  . vancomycin  1,250 mg Intravenous Q12H   Continuous Infusions:    Time spent: > 35 minutes    Penny Pia  [facility] Pager [phone] If 7PM-7AM, please contact night-coverage at [email or web address], password [facility] 05/30/2014, 1:38 PM  LOS: 4 days

## 2014-05-31 LAB — CBC WITH DIFFERENTIAL/PLATELET
BASOS ABS: 0 10*3/uL (ref 0.0–0.1)
Basophils Relative: 1 % (ref 0–1)
EOS ABS: 0.3 10*3/uL (ref 0.0–0.7)
Eosinophils Relative: 5 % (ref 0–5)
HCT: 38.1 % — ABNORMAL LOW (ref 39.0–52.0)
HEMOGLOBIN: 13.1 g/dL (ref 13.0–17.0)
Lymphocytes Relative: 14 % (ref 12–46)
Lymphs Abs: 0.9 10*3/uL (ref 0.7–4.0)
MCH: 30.3 pg (ref 26.0–34.0)
MCHC: 34.4 g/dL (ref 30.0–36.0)
MCV: 88.2 fL (ref 78.0–100.0)
MONOS PCT: 9 % (ref 3–12)
Monocytes Absolute: 0.6 10*3/uL (ref 0.1–1.0)
NEUTROS ABS: 4.5 10*3/uL (ref 1.7–7.7)
NEUTROS PCT: 71 % (ref 43–77)
Platelets: 227 10*3/uL (ref 150–400)
RBC: 4.32 MIL/uL (ref 4.22–5.81)
RDW: 12.5 % (ref 11.5–15.5)
WBC: 6.4 10*3/uL (ref 4.0–10.5)

## 2014-05-31 LAB — COMPREHENSIVE METABOLIC PANEL
ALK PHOS: 50 U/L (ref 39–117)
ALT: <5 U/L (ref 0–53)
ANION GAP: 12 (ref 5–15)
AST: 29 U/L (ref 0–37)
Albumin: 3 g/dL — ABNORMAL LOW (ref 3.5–5.2)
BUN: 15 mg/dL (ref 6–23)
CALCIUM: 8.8 mg/dL (ref 8.4–10.5)
CO2: 23 meq/L (ref 19–32)
Chloride: 105 mEq/L (ref 96–112)
Creatinine, Ser: 0.6 mg/dL (ref 0.50–1.35)
GFR calc Af Amer: >90 mL/min (ref >90)
Glucose, Bld: 105 mg/dL — ABNORMAL HIGH (ref 70–99)
Potassium: 3.8 mEq/L (ref 3.7–5.3)
Sodium: 140 mEq/L (ref 137–147)
TOTAL PROTEIN: 6.2 g/dL (ref 6.0–8.3)
Total Bilirubin: 0.3 mg/dL (ref 0.3–1.2)

## 2014-05-31 MED ORDER — CIPROFLOXACIN HCL 500 MG PO TABS
500.0000 mg | ORAL_TABLET | Freq: Two times a day (BID) | ORAL | Status: DC
  Administered 2014-05-31 – 2014-06-02 (×4): 500 mg via ORAL
  Filled 2014-05-31 (×7): qty 1

## 2014-05-31 NOTE — Progress Notes (Signed)
PHARMACIST - PHYSICIAN COMMUNICATION DR:   Cena Benton CONCERNING: Antibiotic IV to Oral Route Change Policy  RECOMMENDATION: This patient is receiving ciprofloxacin by the intravenous route.  Based on criteria approved by the Pharmacy and Therapeutics Committee, the antibiotic(s) is/are being converted to the equivalent oral dose form(s).   DESCRIPTION: These criteria include:  Patient being treated for a respiratory tract infection, urinary tract infection, cellulitis or clostridium difficile associated diarrhea if on metronidazole  The patient is not neutropenic and does not exhibit a GI malabsorption state  The patient is eating (either orally or via tube) and/or has been taking other orally administered medications for a least 24 hours  The patient is improving clinically and has a Tmax < 100.5  If you have questions about this conversion, please contact the Pharmacy Department    [phone] )  Jeani Hawking   [phone] )  [facility]    [phone] )  [facility]   [phone] )  Ilene Qua    Celedonio Miyamoto, PharmD, BCPS Clinical Pharmacist Pager [phone]

## 2014-05-31 NOTE — Clinical Social Work Note (Signed)
CSW was informed by nurse during rounds that patient is total care at this time and is looking like they would require SNF.  CSW followed up with [facility] to discuss patient's baseline needs.  [facility] admissions coordinator, Rosey Bath, informed CSW that patient usually requires limited assistance (1 person assist) but that when the patient has had UTI's in the past the patient has been total assist until the issue resolves.  CSW informed nurse.    CSW will continue to follow.  Merlyn Lot, LCSWA Clinical Social Worker [phone]

## 2014-05-31 NOTE — Evaluation (Signed)
Physical Therapy Evaluation Patient Details Name: Joseph [name] MRN: [id number] DOB: 03/02/1941 Today's Date: 05/31/2014   History of Present Illness  Patient is a 73 year old who presented with metabolic encephalopathy secondary to infectious etiology (UTI).  Became debilitated with new assistance requirements. Physical therapy consulted  Clinical Impression   Pt admitted with above. Pt currently with functional limitations due to the deficits listed below (see PT Problem List).  Pt will benefit from skilled PT to increase their independence and safety with mobility to allow discharge to the venue listed below.   Currently requires +2 or more assist for mobility; not sure that [facility] can support that level of assist; will need SNF for rehab to maximize independence and safety with mobility, and facilitate dc back to ALF      Follow Up Recommendations SNF    Equipment Recommendations  Wheelchair (measurements PT);Wheelchair cushion (measurements PT)    Recommendations for Other Services       Precautions / Restrictions Precautions Precautions: Fall      Mobility  Bed Mobility Overal bed mobility: Needs Assistance;+2 for physical assistance Bed Mobility: Supine to Sit;Sit to Supine     Supine to sit: Total assist Sit to supine: +2 for physical assistance;+2 for safety/equipment;Total assist   General bed mobility comments: very stiff trunk and completely dependent on caregiver for effective rolling in bed; Able to sit up with total assit of 1, needed mor assist to lay back down for safety as pt was wuite close to the foot of the bed  Transfers Overall transfer level: Needs assistance Equipment used: 1 person hand held assist;2 person hand held assist (and support given at belt) Transfers: Sit to/from [employer] Sit to Stand: Max assist;Total assist;+2 physical assistance Stand pivot transfers: Total assist;+2 physical assistance       General  transfer comment: Very stiff and unable to help in performance of transfers; initial attempt for sit to stand was with some success with knee blocked, but still had to sit back down to bed for safety; Attempt at basic pivot transfer unsuccessful with +3 help  Ambulation/Gait                Stairs            Wheelchair Mobility    Modified Rankin (Stroke Patients Only)       Balance Overall balance assessment: Needs assistance Sitting-balance support: Bilateral upper extremity supported Sitting balance-Leahy Scale: Fair                                       Pertinent Vitals/Pain Pain Assessment: No/denies pain    Home Living Family/patient expects to be discharged to:: Assisted living               Home Equipment:  (to be determined) Additional Comments: Poor historian    Prior Function Level of Independence: Needs assistance         Comments: Per SW note: "[facility] admissions coordinator, Rosey Bath, informed CSW that patient usually requires limited assistance (1 person assist) but that when the patient has had UTI's in the past the patient has been total assist until the issue resolves. "      Hand Dominance        Extremity/Trunk Assessment   Upper Extremity Assessment: Generalized weakness           Lower Extremity Assessment: RLE deficits/detail;LLE  deficits/detail RLE Deficits / Details: Noted stifness throughout ROM into hip and knee extension, but able to get knee fully straight LLE Deficits / Details: Significantly impaired knee extension ROM with contractures; tends to keep LE externally rotated, with extreme difficulty getting foot flat on floor when sitting EOB  Cervical / Trunk Assessment: Other exceptions  Communication   Communication: Expressive difficulties  Cognition Arousal/Alertness: Awake/alert Behavior During Therapy: Flat affect Overall Cognitive Status: No family/caregiver present to determine  baseline cognitive functioning                      General Comments      Exercises        Assessment/Plan    PT Assessment Patient needs continued PT services  PT Diagnosis Generalized weakness   PT Problem List Decreased strength;Decreased range of motion;Decreased activity tolerance;Decreased balance;Decreased mobility;Decreased coordination;Decreased cognition;Decreased knowledge of use of DME  PT Treatment Interventions DME instruction;Gait training;Functional mobility training;Therapeutic activities;Therapeutic exercise;Balance training;Neuromuscular re-education;Cognitive remediation;Patient/family education   PT Goals (Current goals can be found in the Care Plan section) Acute Rehab PT Goals PT Goal Formulation: Patient unable to participate in goal setting Time For Goal Achievement: 06/14/14 Potential to Achieve Goals: Fair    Frequency Min 2X/week   Barriers to discharge        Co-evaluation               End of Session Equipment Utilized During Treatment: Gait belt Activity Tolerance: Patient tolerated treatment well Patient left: in bed;with call bell/phone within reach Nurse Communication: Mobility status;Other (comment) (need to turn on bed alarm)         Time: 1541-1611 PT Time Calculation (min): 30 min   Charges:   PT Evaluation $Initial PT Evaluation Tier I: 1 Procedure PT Treatments $Therapeutic Activity: 23-37 mins   PT G Codes:          Olen Pel 05/31/2014, 5:00 PM  Van Clines, [place]  Acute Rehabilitation Services Pager [phone] Office [phone]

## 2014-05-31 NOTE — Progress Notes (Signed)
[facility] PROGRESS NOTE  Joseph [name] ZOX:[id number] DOB: 05-03-1941 DOA: 05/26/2014 PCP: Florentina Jenny, MD Brief narrative: Patient is a 73 year old who presented with metabolic encephalopathy secondary to infectious etiology (UTI).  Became debilitated with new assistance requirements. Physical therapy consulted  Assessment/Plan: Active Problems:   Encephalopathy - Most likely secondary to infectious etiology. Resolving on IV antibiotics - Will discontinue azithromycin and I suspect that infectious source most likely secondary to active UTI - Pt reportedly developing rash on Rocephin.   - Resolving. But patient has developed debility as such physical therapy consulted.    UTI (lower urinary tract infection) - Urine culture growing Proteus mirabilis, sensitivities reviewed - Awaiting report results. We'll continue third-generation cephalosporin at this juncture - Day 6/7 of antibiotics  Positive blood culture - 1 of 2 positive. - Will await cultures and sensitivities to see if this represents contamination - Discussed with ID specialist and currently most likely cause of positive blood culture is contamination.  Will discontinue vancomycin  Code Status: full Family Communication: None at bedside. Disposition Plan: Pending physical therapy evaluation and recommendations. To go home on cipro   Consultants:  None  Procedures:  None  Antibiotics:  Cipro  HPI/Subjective: Pt more alert.  No new complaints reported to me overnight.  Objective: Filed Vitals:   05/31/14 0523  BP: 109/63  Pulse: 64  Temp: 98.8 F (37.1 C)  Resp: 19    Intake/Output Summary (Last 24 hours) at 05/31/14 1441 Last data filed at 05/31/14 0526  Gross per 24 hour  Intake    120 ml  Output    825 ml  Net   -705 ml   Filed Weights   05/29/14 0904 05/30/14 0522 05/31/14 0523  Weight: 105.4 kg (232 lb 5.8 oz) 106.3 kg (234 lb 5.6 oz) 104.9 kg (231 lb 4.2 oz)    Exam:   General:   Pt in nad, alert and awake  Cardiovascular: rrr, no mrg  Respiratory: cta bl, no wheezes  Abdomen: soft, ND  Data Reviewed: Basic Metabolic Panel:  Recent Labs Lab 05/26/14 2251 05/27/14 0150 05/28/14 0435 05/29/14 0438 05/30/14 0530  NA 142 140 142 141 142  K 3.6* 3.7 4.4 3.9 4.0  CL 103 102 105 104 107  CO2 GLUCOSE 102* 119* 93 95 94  BUN CREATININE 0.72  0.75 0.73 0.65 0.64 0.74  CALCIUM 8.7 8.8 8.8 8.9 8.5  MG 2.1  --   --   --   --    Liver Function Tests:  Recent Labs Lab 05/26/14 2105 05/27/14 0150 05/28/14 0435 05/29/14 0438 05/30/14 0530  AST ALT ALKPHOS 61 50 52 52 50  BILITOT 0.6 0.6 0.4 0.3 0.5  PROT 7.6 6.3 6.1 6.4 6.1  ALBUMIN 3.8 3.2* 3.1* 3.1* 3.0*   No results found for this basename: LIPASE, AMYLASE,  in the last 168 hours  Recent Labs Lab 05/27/14 0126  AMMONIA 45   CBC:  Recent Labs Lab 05/27/14 0150 05/28/14 0435 05/29/14 0438 05/30/14 0530 05/31/14 1240  WBC 9.7 7.7 7.1 7.2 6.4  NEUTROABS 7.2 5.7 5.0 4.8 4.5  HGB 12.6* 13.0 13.1 12.8* 13.1  HCT 36.8* 39.0 38.2* 38.0* 38.1*  MCV 88.7 90.1 87.6 88.4 88.2  PLT 203 199 218 224 227   Cardiac Enzymes: No results found for this basename: CKTOTAL, CKMB, CKMBINDEX, TROPONINI,  in the  last 168 hours BNP (last 3 results)  Recent Labs  05/26/14 2105  PROBNP 354.0*   CBG:  Recent Labs Lab 05/26/14 2110  GLUCAP 102*    Recent Results (from the past 240 hour(s))  CULTURE, BLOOD (ROUTINE X 2)     Status: None   Collection Time    05/26/14  9:10 PM      Result Value Ref Range Status   Specimen Description BLOOD RIGHT ARM   Final   Special Requests BOTTLES DRAWN AEROBIC AND ANAEROBIC [facility] EACH   Final   Culture  Setup Time     Final   Value: 05/27/2014 04:19     Performed at [facility]   Culture     Final   Value: STAPHYLOCOCCUS SPECIES (COAGULASE NEGATIVE)     Note: THE SIGNIFICANCE OF ISOLATING  THIS ORGANISM FROM A SINGLE SET OF BLOOD CULTURES WHEN MULTIPLE SETS ARE DRAWN IS UNCERTAIN. PLEASE NOTIFY THE MICROBIOLOGY DEPARTMENT WITHIN ONE WEEK IF SPECIATION AND SENSITIVITIES ARE REQUIRED.     Note: Gram Stain Report Called to,Read Back By and Verified With: LOURDES ABIERA ON 05/27/2014 AT 11:17P BY WILEJ     Performed at [facility]   Report Status 05/30/2014 FINAL   Final  URINE CULTURE     Status: None   Collection Time    05/26/14  9:27 PM      Result Value Ref Range Status   Specimen Description URINE, RANDOM   Final   Special Requests NONE   Final   Culture  Setup Time     Final   Value: 05/27/2014 09:16     Performed at [facility] Count     Final   Value: >=100,000 COLONIES/ML     Performed at [facility]   Culture     Final   Value: PROTEUS MIRABILIS     Performed at [facility]   Report Status 05/29/2014 FINAL   Final   Organism ID, Bacteria PROTEUS MIRABILIS   Final  CULTURE, BLOOD (ROUTINE X 2)     Status: None   Collection Time    05/26/14  9:36 PM      Result Value Ref Range Status   Specimen Description BLOOD RIGHT HAND   Final   Special Requests BOTTLES DRAWN AEROBIC AND ANAEROBIC 5CC   Final   Culture  Setup Time     Final   Value: 05/27/2014 04:17     Performed at [facility]   Culture     Final   Value:        BLOOD CULTURE RECEIVED NO GROWTH TO DATE CULTURE WILL BE HELD FOR 5 DAYS BEFORE ISSUING A FINAL NEGATIVE REPORT     Performed at [facility]   Report Status PENDING   Incomplete  MRSA PCR SCREENING     Status: None   Collection Time    05/27/14  2:13 AM      Result Value Ref Range Status   MRSA by PCR NEGATIVE  NEGATIVE Final   Comment:            The GeneXpert MRSA Assay (FDA     approved for NASAL specimens     only), is one component of a     comprehensive MRSA colonization     surveillance program. It is not     intended to diagnose MRSA     infection nor to guide or  monitor treatment for     MRSA infections.     Studies: No results found.  Scheduled Meds: . antiseptic oral rinse  7 mL Mouth Rinse q12n4p  . carbidopa-levodopa  1.5 tablet Oral 3 times per day  . chlorhexidine  15 mL Mouth Rinse BID  . ciprofloxacin  500 mg Oral BID  . donepezil  5 mg Oral QHS  . escitalopram  20 mg Oral q morning - 10a  . heparin  5,000 Units Subcutaneous 3 times per day  . QUEtiapine  50 mg Oral QHS   Continuous Infusions:    Time spent: > 35 minutes    Penny Pia  [facility] Pager [phone] If 7PM-7AM, please contact night-coverage at [email or web address], password [facility] 05/31/2014, 2:41 PM  LOS: 5 days

## 2014-06-01 DIAGNOSIS — N390 Urinary tract infection, site not specified: Principal | ICD-10-CM

## 2014-06-01 DIAGNOSIS — G9341 Metabolic encephalopathy: Secondary | ICD-10-CM

## 2014-06-01 DIAGNOSIS — G20 Parkinson's disease: Secondary | ICD-10-CM

## 2014-06-01 DIAGNOSIS — F0280 Dementia in other diseases classified elsewhere without behavioral disturbance: Secondary | ICD-10-CM

## 2014-06-01 LAB — CBC WITH DIFFERENTIAL/PLATELET
Basophils Absolute: 0 10*3/uL (ref 0.0–0.1)
Basophils Relative: 1 % (ref 0–1)
EOS ABS: 0.4 10*3/uL (ref 0.0–0.7)
Eosinophils Relative: 6 % — ABNORMAL HIGH (ref 0–5)
HCT: 37.4 % — ABNORMAL LOW (ref 39.0–52.0)
HEMOGLOBIN: 12.9 g/dL — AB (ref 13.0–17.0)
LYMPHS PCT: 15 % (ref 12–46)
Lymphs Abs: 1 10*3/uL (ref 0.7–4.0)
MCH: 30.4 pg (ref 26.0–34.0)
MCHC: 34.5 g/dL (ref 30.0–36.0)
MCV: 88.2 fL (ref 78.0–100.0)
MONOS PCT: 10 % (ref 3–12)
Monocytes Absolute: 0.6 10*3/uL (ref 0.1–1.0)
NEUTROS PCT: 68 % (ref 43–77)
Neutro Abs: 4.2 10*3/uL (ref 1.7–7.7)
PLATELETS: 220 10*3/uL (ref 150–400)
RBC: 4.24 MIL/uL (ref 4.22–5.81)
RDW: 12.5 % (ref 11.5–15.5)
WBC: 6.2 10*3/uL (ref 4.0–10.5)

## 2014-06-01 MED ORDER — QUETIAPINE FUMARATE 50 MG PO TABS: 50.0000 mg | ORAL_TABLET | Freq: Every day | ORAL | Status: DC

## 2014-06-01 MED ORDER — LORAZEPAM 0.5 MG PO TABS: 0.2500 mg | ORAL_TABLET | Freq: Two times a day (BID) | ORAL | Status: DC | PRN

## 2014-06-01 MED ORDER — CIPROFLOXACIN HCL 500 MG PO TABS: 500.0000 mg | ORAL_TABLET | Freq: Two times a day (BID) | ORAL | Status: DC

## 2014-06-01 MED ORDER — ESCITALOPRAM OXALATE 20 MG PO TABS: 20.0000 mg | ORAL_TABLET | Freq: Every morning | ORAL | Status: DC

## 2014-06-01 NOTE — Progress Notes (Signed)
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 Patient has been placed on ciprofloxacin - Continue Aricept, Lexapro, Seroquel  Active Problems: Proteus mirabilis UTI -Patient had rash with IV Rocephin, which was discontinued and patient was placed on ciprofloxacin.     Hypertension - Currently stable    Diastolic heart failure - Currently euvolemic, I/O's 416 cc negative  DVT Prophylaxis:  Code Status:  Family Communication: called patient's son, Delwin Raczkowski and left detail message, he did not pick up the phone   Disposition: likely in AM   Consultants:  none  Procedures:  none   Antibiotics:  IV rocephin dc'ed  Oral cipro 9/21 >>    Subjective: Patient seen and examined, confused but alert and awake, oriented to self  Objective: Weight change:   Intake/Output Summary (Last 24 hours) at 06/01/14 1123 Last data filed at 06/01/14 0857  Gross per 24 hour  Intake    560 ml  Output   1000 ml  Net   -440 ml   Blood pressure 133/53, pulse 57, temperature 98 F (36.7 C), temperature source Oral, resp. rate 18, height  (1.778 m), weight 104.9 kg (231 lb 4.2 oz), SpO2 96.00%.  Physical Exam: General: Alert and awake, oriented  to self, NAD  CVS: S1-S2 clear, no murmur rubs or gallops Chest: clear to auscultation bilaterally, no wheezing, rales or rhonchi Abdomen: soft nontender, nondistended, normal bowel sounds  Extremities: no cyanosis, clubbing or edema noted bilaterally   Lab Results: Basic Metabolic Panel:  Recent Labs Lab 05/26/14 2251  05/30/14 0530 05/31/14 1240  NA 142  < > 142 140  K 3.6*   < > 4.0 3.8  CL 103  < > 107 105  CO2 26  < > 24 23  GLUCOSE 102*  < > 94 105*  BUN 21  < > 12 15  CREATININE 0.72  0.75  < > 0.74 0.60  CALCIUM 8.7  < > 8.5 8.8  MG 2.1  --   --   --   < > = values in this interval not displayed. Liver Function Tests:  Recent Labs Lab 05/30/14 0530 05/31/14 1240  AST 17 29  ALT 15 <5  ALKPHOS 50 50  BILITOT 0.5 0.3  PROT 6.1 6.2  ALBUMIN 3.0* 3.0*   No results found for this basename: LIPASE, AMYLASE,  in the last 168 hours  Recent Labs Lab 05/27/14 0126  AMMONIA 45   CBC:  Recent Labs Lab 05/31/14 1240 06/01/14 0418  WBC 6.4 6.2  NEUTROABS 4.5 4.2  HGB 13.1 12.9*  HCT 38.1* 37.4*  MCV 88.2 88.2  PLT 227 220   Cardiac Enzymes: No results found for this basename: CKTOTAL, CKMB, CKMBINDEX, TROPONINI,  in the last 168 hours BNP: No components found with this basename: POCBNP,  CBG:  Recent Labs Lab 05/26/14 2110  GLUCAP 102*     Micro Results: Recent Results (from the past 240 hour(s))  CULTURE, BLOOD (ROUTINE X 2)     Status: None   Collection Time    05/26/14  9:10 PM      Result Value Ref Range Status   Specimen Description BLOOD RIGHT  ARM   Final   Special Requests BOTTLES DRAWN AEROBIC AND ANAEROBIC [facility] EACH   Final   Culture  Setup Time     Final   Value: 05/27/2014 04:19     Performed at [facility]   Culture     Final   Value: STAPHYLOCOCCUS SPECIES (COAGULASE NEGATIVE)     Note: THE SIGNIFICANCE OF ISOLATING THIS ORGANISM FROM A SINGLE SET OF BLOOD CULTURES WHEN MULTIPLE SETS ARE DRAWN IS UNCERTAIN. PLEASE NOTIFY THE MICROBIOLOGY DEPARTMENT WITHIN ONE WEEK IF SPECIATION AND SENSITIVITIES ARE REQUIRED.     Note: Gram Stain Report Called to,Read Back By and Verified With: LOURDES ABIERA ON 05/27/2014 AT 11:17P BY WILEJ     Performed at [facility]   Report Status 05/30/2014 FINAL   Final  URINE CULTURE     Status: None   Collection Time    05/26/14  9:27 PM      Result Value Ref Range  Status   Specimen Description URINE, RANDOM   Final   Special Requests NONE   Final   Culture  Setup Time     Final   Value: 05/27/2014 09:16     Performed at [facility] Count     Final   Value: >=100,000 COLONIES/ML     Performed at [facility]   Culture     Final   Value: PROTEUS MIRABILIS     Performed at [facility]   Report Status 05/29/2014 FINAL   Final   Organism ID, Bacteria PROTEUS MIRABILIS   Final  CULTURE, BLOOD (ROUTINE X 2)     Status: None   Collection Time    05/26/14  9:36 PM      Result Value Ref Range Status   Specimen Description BLOOD RIGHT HAND   Final   Special Requests BOTTLES DRAWN AEROBIC AND ANAEROBIC 5CC   Final   Culture  Setup Time     Final   Value: 05/27/2014 04:17     Performed at [facility]   Culture     Final   Value:        BLOOD CULTURE RECEIVED NO GROWTH TO DATE CULTURE WILL BE HELD FOR 5 DAYS BEFORE ISSUING A FINAL NEGATIVE REPORT     Performed at [facility]   Report Status PENDING   Incomplete  MRSA PCR SCREENING     Status: None   Collection Time    05/27/14  2:13 AM      Result Value Ref Range Status   MRSA by PCR NEGATIVE  NEGATIVE Final   Comment:            The GeneXpert MRSA Assay (FDA     approved for NASAL specimens     only), is one component of a     comprehensive MRSA colonization     surveillance program. It is not     intended to diagnose MRSA     infection nor to guide or     monitor treatment for     MRSA infections.    Studies/Results: Dg Chest 1 View  05/26/2014   CLINICAL DATA:  Fever, altered mental status  EXAM: CHEST - 1 VIEW  COMPARISON:  03/19/2013  FINDINGS: Low lung volumes persist with central vascular congestion and diffuse prominence of the interstitial markings but no focal acute finding. The cardiomegaly again noted. Bilateral glenohumeral joint and AC joint degenerative change.  IMPRESSION: Stable  findings as above.  No new acute focal finding.    Electronically Signed   By: Christiana Pellant M.D.   On: 05/26/2014 22:49   Dg Chest 2 View  05/28/2014   CLINICAL DATA:  Encephalopathy, fever, altered mental status  EXAM: CHEST  2 VIEW  COMPARISON:  05/26/2014  FINDINGS: The patient is rotated to the left. Cardiomegaly persists with central vascular congestion slightly improved since previously. No new pulmonary opacity allowing for hypoaeration. Small pleural effusions are noted.  IMPRESSION: Improving vascular congestion with persistent trace pleural effusions possibly in the setting of improving interstitial edema.   Electronically Signed   By: Christiana Pellant M.D.   On: 05/28/2014 08:49    Medications: Scheduled Meds: . antiseptic oral rinse  7 mL Mouth Rinse q12n4p  . carbidopa-levodopa  1.5 tablet Oral 3 times per day  . chlorhexidine  15 mL Mouth Rinse BID  . ciprofloxacin  500 mg Oral BID  . donepezil  5 mg Oral QHS  . escitalopram  20 mg Oral q morning - 10a  . heparin  5,000 Units Subcutaneous 3 times per day  . QUEtiapine  50 mg Oral QHS      LOS: 6 days   Kellin Fifer M.D. [facility] 06/01/2014, 11:23 AM Pager: [phone]  If 7PM-7AM, please contact night-coverage [email or web address] Password [email or web address]  **Disclaimer: This note was dictated with voice recognition software. Similar sounding words can inadvertently be transcribed and this note may contain transcription errors which may not have been corrected upon publication of note.**

## 2014-06-01 NOTE — Progress Notes (Signed)
ANTIBIOTIC CONSULT NOTE - FOLLOW UP  Pharmacy Consult:  Cipro Indication:  Proteus UTI  No Known Allergies  Patient Measurements: Height:  (177.8 cm) Weight: 231 lb 4.2 oz (104.9 kg) IBW/kg (Calculated) : 73  Vital Signs: Temp: 98 F (36.7 C) (09/22 0544) Temp src: Oral (09/22 0544) BP: 133/53 mmHg (09/22 0544) Pulse Rate: 57 (09/22 0544) Intake/Output from previous day: 09/21 0701 - 09/22 0700 In: 560 [P.O.:560] Out: 1000 [Urine:1000] Intake/Output from this shift: Total I/O In: 120 [P.O.:120] Out: -   Labs:  Recent Labs  05/30/14 0530 05/31/14 1240 06/01/14 0418  WBC 7.2 6.4 6.2  HGB 12.8* 13.1 12.9*  PLT 224 227 220  CREATININE 0.74 0.60  --    Estimated Creatinine Clearance: 98.3 ml/min (by C-G formula based on Cr of 0.6). No results found for this basename: VANCOTROUGH, VANCOPEAK, VANCORANDOM, GENTTROUGH, GENTPEAK, GENTRANDOM, TOBRATROUGH, TOBRAPEAK, TOBRARND, AMIKACINPEAK, AMIKACINTROU, AMIKACIN,  in the last 72 hours   Microbiology: Recent Results (from the past 720 hour(s))  CULTURE, BLOOD (ROUTINE X 2)     Status: None   Collection Time    05/26/14  9:10 PM      Result Value Ref Range Status   Specimen Description BLOOD RIGHT ARM   Final   Special Requests BOTTLES DRAWN AEROBIC AND ANAEROBIC [facility] EACH   Final   Culture  Setup Time     Final   Value: 05/27/2014 04:19     Performed at [facility]   Culture     Final   Value: STAPHYLOCOCCUS SPECIES (COAGULASE NEGATIVE)     Note: THE SIGNIFICANCE OF ISOLATING THIS ORGANISM FROM A SINGLE SET OF BLOOD CULTURES WHEN MULTIPLE SETS ARE DRAWN IS UNCERTAIN. PLEASE NOTIFY THE MICROBIOLOGY DEPARTMENT WITHIN ONE WEEK IF SPECIATION AND SENSITIVITIES ARE REQUIRED.     Note: Gram Stain Report Called to,Read Back By and Verified With: LOURDES ABIERA ON 05/27/2014 AT 11:17P BY WILEJ     Performed at [facility]   Report Status 05/30/2014 FINAL   Final  URINE CULTURE     Status: None   Collection  Time    05/26/14  9:27 PM      Result Value Ref Range Status   Specimen Description URINE, RANDOM   Final   Special Requests NONE   Final   Culture  Setup Time     Final   Value: 05/27/2014 09:16     Performed at [facility] Count     Final   Value: >=100,000 COLONIES/ML     Performed at [facility]   Culture     Final   Value: PROTEUS MIRABILIS     Performed at [facility]   Report Status 05/29/2014 FINAL   Final   Organism ID, Bacteria PROTEUS MIRABILIS   Final  CULTURE, BLOOD (ROUTINE X 2)     Status: None   Collection Time    05/26/14  9:36 PM      Result Value Ref Range Status   Specimen Description BLOOD RIGHT HAND   Final   Special Requests BOTTLES DRAWN AEROBIC AND ANAEROBIC 5CC   Final   Culture  Setup Time     Final   Value: 05/27/2014 04:17     Performed at [facility]   Culture     Final   Value:        BLOOD CULTURE RECEIVED NO GROWTH TO DATE CULTURE WILL BE HELD FOR 5 DAYS  BEFORE ISSUING A FINAL NEGATIVE REPORT     Performed at [facility]   Report Status PENDING   Incomplete  MRSA PCR SCREENING     Status: None   Collection Time    05/27/14  2:13 AM      Result Value Ref Range Status   MRSA by PCR NEGATIVE  NEGATIVE Final   Comment:            The GeneXpert MRSA Assay (FDA     approved for NASAL specimens     only), is one component of a     comprehensive MRSA colonization     surveillance program. It is not     intended to diagnose MRSA     infection nor to guide or     monitor treatment for     MRSA infections.      Assessment: 73 YOM to continue on Cipro for Proteus UTI.  Patient's renal function is stable.   CTX 9/16 >> 9/19 due to rash Azith 9/17 >> 9/18 Vanc 9/18 >> 9/20 Cipro 9/19 >>  9/16 BC - CoNS (1 of 2, likely contaminant) 9/16 Ucx > 100 proteus mirabilis (S to cipro)   Goal of Therapy:  Clearance of infection   Plan:  - Continue Cipro  PO BID - Pharmacy will sign  off as dosage adjustment likely unnecessary.  Thank you for the consult!  Consider establishing a stop date.    Fares Ramthun D. Laney Potash, PharmD, BCPS Pager:  [phone] 06/01/2014, 1:24 PM

## 2014-06-01 NOTE — Clinical Social Work Note (Signed)
CSW spoke with PT main office.  Patient to be seen first thing tomorrow (9/23) morning for eval.  CSW will continue to follow.  Merlyn Lot, LCSWA Clinical Social Worker [phone]

## 2014-06-02 DIAGNOSIS — I5032 Chronic diastolic (congestive) heart failure: Secondary | ICD-10-CM

## 2014-06-02 LAB — CBC WITH DIFFERENTIAL/PLATELET
Basophils Absolute: 0 10*3/uL (ref 0.0–0.1)
Basophils Relative: 0 % (ref 0–1)
Eosinophils Absolute: 0.4 10*3/uL (ref 0.0–0.7)
Eosinophils Relative: 6 % — ABNORMAL HIGH (ref 0–5)
HCT: 38.6 % — ABNORMAL LOW (ref 39.0–52.0)
Hemoglobin: 13.1 g/dL (ref 13.0–17.0)
LYMPHS PCT: 15 % (ref 12–46)
Lymphs Abs: 1.1 10*3/uL (ref 0.7–4.0)
MCH: 29.8 pg (ref 26.0–34.0)
MCHC: 33.9 g/dL (ref 30.0–36.0)
MCV: 87.7 fL (ref 78.0–100.0)
MONOS PCT: 10 % (ref 3–12)
Monocytes Absolute: 0.7 10*3/uL (ref 0.1–1.0)
Neutro Abs: 4.8 10*3/uL (ref 1.7–7.7)
Neutrophils Relative %: 69 % (ref 43–77)
PLATELETS: 230 10*3/uL (ref 150–400)
RBC: 4.4 MIL/uL (ref 4.22–5.81)
RDW: 12.3 % (ref 11.5–15.5)
WBC: 6.9 10*3/uL (ref 4.0–10.5)

## 2014-06-02 LAB — CULTURE, BLOOD (ROUTINE X 2): CULTURE: NO GROWTH

## 2014-06-02 MED ORDER — CIPROFLOXACIN HCL 500 MG PO TABS: 500.0000 mg | ORAL_TABLET | Freq: Two times a day (BID) | ORAL | Status: DC

## 2014-06-02 MED ORDER — LORAZEPAM 0.5 MG PO TABS: 0.2500 mg | ORAL_TABLET | Freq: Two times a day (BID) | ORAL | Status: AC | PRN

## 2014-06-02 MED ORDER — ESCITALOPRAM OXALATE 20 MG PO TABS: 20.0000 mg | ORAL_TABLET | Freq: Every morning | ORAL | Status: DC

## 2014-06-02 MED ORDER — QUETIAPINE FUMARATE 50 MG PO TABS: 50.0000 mg | ORAL_TABLET | Freq: Every day | ORAL | Status: AC

## 2014-06-02 NOTE — Clinical Social Work Note (Signed)
CSW spoke with Annice Pih, Charity fundraiser, at [facility] concerning patients current mobility.  [facility] has taken care of the patient in the past when he has been limited mobility and is willing to take the patient to avoid the confusion of going to SNF.  CSW will continue to follow.  Merlyn Lot, LCSWA Clinical Social Worker [phone]

## 2014-06-02 NOTE — Clinical Social Work Note (Signed)
Patient will discharge to [facility] Anticipated discharge date: 06/02/14 Family notified:[employer] by [employer]- scheduled for 3:30  CSW signing off.  Merlyn Lot, LCSWA Clinical Social Worker [phone]

## 2014-06-02 NOTE — Discharge Summary (Signed)
Physician Discharge Summary  Patient ID: Joseph [name] MRN: [id number] DOB/AGE: 12-13-1940 73 y.o.  Admit date: 05/26/2014 Discharge date: 06/02/2014  Primary Care Physician:  Florentina Jenny, MD  Discharge Diagnoses:    . Proteus mirabilis UTI (lower urinary tract infection) . Acute encephalopathy . Dementia with behavioral disturbance . Diastolic heart failure . Dementia in Parkinson's disease . Hypertension  Consults: None   Recommendations for Outpatient Follow-up:  Patient will need a total assistance with ADLs, feeding and bath etc.   Allergies:  No Known Allergies   Discharge Medications:   Medication List         BAZA PROTECT EX  Apply 1 application topically daily as needed (burning).     carbidopa-levodopa 25-100 MG per tablet  Commonly known as:  SINEMET IR  Take 1.5 tablets by mouth 3 (three) times daily. At 5am, 10am, 4pm     ciprofloxacin 500 MG tablet  Commonly known as:  CIPRO  Take 1 tablet (500 mg total) by mouth 2 (two) times daily. X 2 days     docusate sodium 100 MG capsule  Commonly known as:  COLACE  Take 100 mg by mouth every morning.     donepezil 5 MG tablet  Commonly known as:  ARICEPT  Take 5 mg by mouth at bedtime.     escitalopram 20 MG tablet  Commonly known as:  LEXAPRO  Take 1 tablet (20 mg total) by mouth every morning.     fluocinonide cream 0.05 %  Commonly known as:  LIDEX  Apply 1 application topically daily.     furosemide 20 MG tablet  Commonly known as:  LASIX  Take 1 tablet by mouth daily.     hydrochlorothiazide 25 MG tablet  Commonly known as:  HYDRODIURIL  Take 12.5 mg by mouth every morning.     loratadine 10 MG tablet  Commonly known as:  CLARITIN  Take 10 mg by mouth daily as needed for allergies.     LORazepam 0.5 MG tablet  Commonly known as:  ATIVAN  Take 0.5 tablets (0.25 mg total) by mouth 2 (two) times daily as needed for anxiety.     miconazole 2 % cream  Commonly known as:  MICOTIN  Apply 1  application topically daily as needed (burning). Apply to buttocks.     nystatin cream  Commonly known as:  MYCOSTATIN  Apply 1 application topically 2 (two) times daily. Apply to genital area     potassium chloride SA 20 MEQ tablet  Commonly known as:  K-DUR,KLOR-CON  Take 20 mEq by mouth every morning.     psyllium 0.52 G capsule  Commonly known as:  REGULOID  Take 0.52 g by mouth every morning.     QUEtiapine 50 MG tablet  Commonly known as:  SEROQUEL  Take 1 tablet (50 mg total) by mouth at bedtime.         Brief H and P: For complete details please refer to admission H and P, but in briefThis is a 73 y.o. year old male with significant past medical history of parkinsons dementia, HTN, diastolic CHF, depression presenting with encephalopathy, UTI. Level V caveat as pt is acutely confused. Per report, pt noted to be febrile and with worsening confusion from baseline. Reports + cough over past week per report and nasal drainage. Per report, pt usually fairly alert and able to answer questions. However, persistently confused over past 24 hours prior to admission.  Presented to ER, tmax 99.1, HR in  60s, BP 100s-150s systolic, satting 96% on RA. WBC 12.2, Hgb 13.5, K 3.6, Cr 0.72. ProBNP 350 (baseline), lactate 0.4. EKG sinus rhythm. CXR with no acute findings. UA concerning for infection. Started on rocephin.   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 The patient's family had reported that usually patient gets weak and confused with UTIs and subsequently improves. Patient was initially started on IV Rocephin and a Zithromax. However Rocephin was discontinued as he was developing rash.  After urine culture and sensitivities, patient was placed on ciprofloxacin. He needs 2  more days of oral ciprofloxacin to complete full course of UTI.   Dementia - Continue Aricept, Lexapro, Seroquel   Proteus mirabilis UTI  Continue ciprofloxacin  Hypertension - Currently stable   Diastolic heart failure  - Currently euvolemic, I/O's 121 cc negative  Generalized debility: Likely worsened due to UTI, PT OT evaluations were done and recommended skilled nursing facility. The patient's family however requested to continue in the same environment and patient has mental status changes with debility usually with UTI episodes which gradually improves. Recommend physical therapy at the assisted living facility to be continued so that patient can return back to his baseline status.  Day of Discharge BP 102/47  Pulse 55  Temp(Src) 98.6 F (37 C) (Oral)  Resp 17  Ht  (1.778 m)  Wt 99.6 kg (219 lb 9.3 oz)  BMI 31.51 kg/m2  SpO2 93%  Physical Exam: General: Alert and awake oriented x self not in any acute distress. CVS: S1-S2 clear no murmur rubs or gallops Chest: clear to auscultation bilaterally, no wheezing rales or rhonchi Abdomen: soft nontender, nondistended, normal bowel sounds Extremities: no cyanosis, clubbing or edema noted bilaterally    The results of significant diagnostics from this hospitalization (including imaging, microbiology, ancillary and laboratory) are listed below for reference.    LAB RESULTS: Basic Metabolic Panel:  Recent Labs Lab 05/26/14 2251  05/30/14 0530 05/31/14 1240  NA 142  < > 142 140  K 3.6*  < > 4.0 3.8  CL 103  < > 107 105  CO2 26  < > 24 23  GLUCOSE 102*  < > 94 105*  BUN 21  < > 12 15  CREATININE 0.72  0.75  < > 0.74 0.60  CALCIUM 8.7  < > 8.5 8.8  MG 2.1  --   --   --   < > = values in this interval not displayed. Liver Function Tests:  Recent Labs Lab 05/30/14 0530 05/31/14 1240  AST 17 29  ALT 15 <5  ALKPHOS 50 50  BILITOT 0.5 0.3  PROT 6.1 6.2  ALBUMIN 3.0* 3.0*   No results found for this  basename: LIPASE, AMYLASE,  in the last 168 hours  Recent Labs Lab 05/27/14 0126  AMMONIA 45   CBC:  Recent Labs Lab 06/01/14 0418 06/02/14 0405  WBC 6.2 6.9  NEUTROABS 4.2 4.8  HGB 12.9* 13.1  HCT 37.4* 38.6*  MCV 88.2 87.7  PLT 220 230   Cardiac Enzymes: No results found for this basename: CKTOTAL, CKMB, CKMBINDEX, TROPONINI,  in the last 168 hours BNP: No components found with this basename: POCBNP,  CBG:  Recent Labs Lab 05/26/14 2110  GLUCAP 102*    Significant Diagnostic Studies:  Dg Chest 1 View  05/26/2014  CLINICAL DATA:  Fever, altered mental status  EXAM: CHEST - 1 VIEW  COMPARISON:  03/19/2013  FINDINGS: Low lung volumes persist with central vascular congestion and diffuse prominence of the interstitial markings but no focal acute finding. The cardiomegaly again noted. Bilateral glenohumeral joint and AC joint degenerative change.  IMPRESSION: Stable findings as above.  No new acute focal finding.   Electronically Signed   By: Christiana Pellant M.D.   On: 05/26/2014 22:49       Disposition and Follow-up: Assisted living facility with PT, OT, RN   DIET: SOFT diet with assistance   DISCHARGE FOLLOW-UP     Follow-up Information   Follow up with Florentina Jenny, MD. Schedule an appointment as soon as possible for a visit in 2 weeks. (for hospital follow-up)    Specialty:  Family Medicine   Contact information:   [address] [place] [address] [phone]       Time spent on Discharge: 40 minutes  Signed:   RAI,RIPUDEEP M.D. [facility] 06/02/2014, 11:39 AM Pager: [phone]

## 2014-06-02 NOTE — Progress Notes (Signed)
Patient discharged back to [facility] as ordered, report was given to [place] CNA/Med Tech assigned to him with nurse Jennell Corner as her supervisor.

## 2014-06-02 NOTE — Progress Notes (Signed)
Physical Therapy Treatment Patient Details Name: Joseph [name] MRN: [id number] DOB: 1941/08/21 Today's Date: 06/02/2014    History of Present Illness Patient is a 73 year old who presented with metabolic encephalopathy secondary to infectious etiology (UTI).  Became debilitated with new assistance requirements. Physical therapy consulted    PT Comments    Pt continues to require total care at this time. Pt very rigid and was unable to assess transfers during session. Pt will require lift for OOB activities. Contacted CSW regarding D/C planning. Pt will require SNF unless ALF can provide total care.   Follow Up Recommendations  SNF     Equipment Recommendations  Wheelchair (measurements PT);Wheelchair cushion (measurements PT)    Recommendations for Other Services       Precautions / Restrictions Precautions Precautions: Fall Precaution Comments: at risk for skin breakdown Restrictions Weight Bearing Restrictions: No    Mobility  Bed Mobility Overal bed mobility: Needs Assistance;+2 for physical assistance Bed Mobility: Supine to Sit;Sit to Supine;Rolling Rolling: Total assist   Supine to sit: +2 for physical assistance;Total assist;HOB elevated Sit to supine: +2 for physical assistance;Total assist   General bed mobility comments: pt very rigid and keeping Rt knee flexed and crossed over Lt LE during bed mobility; abrasion noted on back and RN made aware; pt total care and not (A) with bed mobility at all during this session   Transfers                 General transfer comment: pt unable to be (A) or have Rt LE (foot_ placed on ground to assess transfers; pt too rigid and unsafe; will require lift for OOB activiaties at this time   Ambulation/Gait                 Stairs            Wheelchair Mobility    Modified Rankin (Stroke Patients Only)       Balance Overall balance assessment: Needs assistance Sitting-balance support: Feet unsupported  (unable to place bil LEs on ground due to rigidity ) Sitting balance-Leahy Scale: Fair Sitting balance - Comments: pt sitting EOB at supervision to min guard level; tolerated sitting 6 min                            Cognition Arousal/Alertness: Awake/alert Behavior During Therapy: Flat affect Overall Cognitive Status: No family/caregiver present to determine baseline cognitive functioning Area of Impairment: Orientation;Following commands;Problem solving;Attention Orientation Level: Disoriented to;Situation;Place;Time Current Attention Level: Focused   Following Commands: Follows one step commands inconsistently     Problem Solving: Slow processing;Difficulty sequencing;Decreased initiation;Requires verbal cues;Requires tactile cues General Comments: minimal verbalizations throughout session; confused and decr motor processing    Exercises      General Comments General comments (skin integrity, edema, etc.): RN made aware of abrasion on back and rolled to show and apply dression      Pertinent Vitals/Pain Pain Assessment: No/denies pain    Home Living                      Prior Function            PT Goals (current goals can now be found in the care plan section) Acute Rehab PT Goals Patient Stated Goal: none stated PT Goal Formulation: Patient unable to participate in goal setting Time For Goal Achievement: 06/14/14 Potential to Achieve Goals: Fair Progress towards PT goals: Not progressing  toward goals - comment (no participatory )    Frequency  Min 2X/week    PT Plan Current plan remains appropriate    Co-evaluation             End of Session   Activity Tolerance: Treatment limited secondary to medical complications (Comment) (limited by ROM and participation ) Patient left: in bed;with call bell/phone within reach;with bed alarm set;with nursing/sitter in room     Time: 1101-1116 PT Time Calculation (min): 15 min  Charges:   $Therapeutic Activity: 8-22 mins                    G CodesDonell Sievert, [place]  [phone] 06/02/2014, 3:15 PM

## 2014-07-01 ENCOUNTER — Ambulatory Visit (INDEPENDENT_AMBULATORY_CARE_PROVIDER_SITE_OTHER): Payer: Medicare Other | Admitting: Diagnostic Neuroimaging

## 2014-07-01 ENCOUNTER — Encounter: Payer: Self-pay | Admitting: Diagnostic Neuroimaging

## 2014-07-01 VITALS — BP 135/65 | HR 57

## 2014-07-01 DIAGNOSIS — G20 Parkinson's disease: Secondary | ICD-10-CM

## 2014-07-01 DIAGNOSIS — F0280 Dementia in other diseases classified elsewhere without behavioral disturbance: Secondary | ICD-10-CM

## 2014-07-01 NOTE — Patient Instructions (Signed)
Continue current medications. 

## 2014-07-01 NOTE — Progress Notes (Signed)
[facility]  PATIENT: Joseph [name] DOB: 24-Oct-1940  REFERRING CLINICIAN:  HISTORY FROM: patient and son REASON FOR VISIT: follow up   HISTORICAL  CHIEF COMPLAINT:  No chief complaint on file.   HISTORY OF PRESENT ILLNESS:   UPDATE 07/01/14: Since last visit, was in hospital for UTI and acute delirium (05/26/14 - 06/02/14). Now back to baseline. Still taking carb/levo, donepezil and seroquel. No reports of wearing off.   UPDATE 12/28/13: Since last visit, hallucinations, reality vs dream diff, continue. No threatening or frightening hallucinations. Tremor is stable. Getting daily therapy sessions.   UPDATE 08/25/13: Since last visit, having more stiffness, esp in legs, esp in early AM when getting out of bed. THis is intermittent, and random. More problems with urinary incontinence. Memory, hallucinations, paranoia, are stable.  UPDATE 05/01/12: Doing well. Better than last time. Behavior and throughts are better. Tremor is better. PT completed.  UPDATE 01/21/12: More problems with staff reporting agitation. Patient thinks that staff is "being rough". An example of conflict includes when the patient is woken up for bed and diaper changes. Patient feels like he is trapped in that they're being rough with him. The staff reports that he is agitated and belligerent. As a solution the staff has suggested for the patient to wear a condom catheter at night time although the patient does refuse. Staff is concerned about development of bed sores if he urinates in the bed without adequate hygiene. He still able walk and use a bedside commode without assistance. Patient's son also reports increasing problems with paranoid thoughts and conspiracy theories. One such example includes a story that the patient tells about Monia SabalBill Clinton, being in an ambulance, and the plate numbers being switched with the patient's own ambulance. Patient thinks that he is seeing this on a TV program but  someone is working to hide this. Patient also thinks that the staff at the facility is "running drugs" in the medicine cart.  PRIOR HPI: 73 year old right-handed male with history of Parkinson's disease, depression, here for evaluation. Patient was diagnosed Parkinson's disease 2007, after developing right upper extremity tremor.  Apparently he was treated with carbidopa levodopa, then comtan was added.  He was also on Topamax, for unclear reasons. May 2012 - patient's wife, who was his primary caregiver passed away. Aug 2012 - patient fell and had left hip fracture. He was treated with surgery.  Went to rehab, then [place] be closer to [place].  [facility], and now lives at [facility]. Since hip fracture, patient has had significant difficulty with ambulation. He is also been having trouble taking his medications due to complaint of headache. He also has intermittent hallucinations and confusion.  REVIEW OF SYSTEMS: Full 14 system review of systems performed and notable only for hearing loss doing double vision joint aching muscles wheelchair-bound confusion decreased concentration depression hallucination frequent UTIs difficulty urinating.   ALLERGIES: No Known Allergies  HOME MEDICATIONS: Outpatient Prescriptions Prior to Visit  Medication Sig Dispense Refill  . carbidopa-levodopa (SINEMET IR) 25-100 MG per tablet Take 1.5 tablets by mouth 3 (three) times daily. At 5am, 10am, 4pm  135 tablet  1  . ciprofloxacin (CIPRO) 500 MG tablet Take 1 tablet (500 mg total) by mouth 2 (two) times daily. X 2 days  4 tablet  0  . docusate sodium (COLACE) 100 MG capsule Take 100 mg by mouth every morning.       . donepezil (  ARICEPT) 5 MG tablet Take 5 mg by mouth at bedtime.      Marland Kitchen escitalopram (LEXAPRO) 20 MG tablet Take 1 tablet (20 mg total) by mouth every morning.  30 tablet  0  . fluocinonide cream (LIDEX) 0.05 % Apply 1 application topically  daily.       . furosemide (LASIX) 20 MG tablet Take 1 tablet by mouth daily.      . hydrochlorothiazide (HYDRODIURIL) 25 MG tablet Take 12.5 mg by mouth every morning.       . loratadine (CLARITIN) 10 MG tablet Take 10 mg by mouth daily as needed for allergies.       Marland Kitchen LORazepam (ATIVAN) 0.5 MG tablet Take 0.5 tablets (0.25 mg total) by mouth 2 (two) times daily as needed for anxiety.  30 tablet  0  . miconazole (MICOTIN) 2 % cream Apply 1 application topically daily as needed (burning). Apply to buttocks.      . nystatin cream (MYCOSTATIN) Apply 1 application topically 2 (two) times daily. Apply to genital area      . potassium chloride SA (K-DUR,KLOR-CON) 20 MEQ tablet Take 20 mEq by mouth every morning.       . psyllium (REGULOID) 0.52 G capsule Take 0.52 g by mouth every morning.       Marland Kitchen QUEtiapine (SEROQUEL) 50 MG tablet Take 1 tablet (50 mg total) by mouth at bedtime.  30 tablet  0  . Skin Protectants, Misc. (BAZA PROTECT EX) Apply 1 application topically daily as needed (burning).        No facility-administered medications prior to visit.    PAST MEDICAL HISTORY: Past Medical History  Diagnosis Date  . Parkinson disease   . Seizures   . Depression   . Hypertension   . Dementia   . UTI (lower urinary tract infection)     PAST SURGICAL HISTORY: Past Surgical History  Procedure Laterality Date  . Hip fracture surgery      FAMILY HISTORY: Family History  Problem Relation Age of Onset  . Pancreatic cancer Mother   . Heart attack Father     SOCIAL HISTORY:  History   Social History  . Marital Status: Widowed    Spouse Name: N/A    Number of Children: 2  . Years of Education: HS   Occupational History  . retired    Social History Main Topics  . Smoking status: Former Smoker    Quit date: 03/20/1963  . Smokeless tobacco: Never Used  . Alcohol Use: Yes  . Drug Use: No  . Sexual Activity: No   Other Topics Concern  . Not on file   Social History Narrative     Patient lives at home at [facility].   Caffeine Use: 2-3 cups daily     PHYSICAL EXAM  Filed Vitals:   07/01/14 1441  BP: 135/65  Pulse: 57    Not recorded    Cannot calculate BMI with a height equal to zero.  General: Patient is awake, alert and in no acute distress.  Well developed and groomed. Neck: Neck is supple. Cardiovascular: No carotid artery bruits.  Heart is regular rate and rhythm with no murmurs.  Neurologic Exam  Mental Status: Awake, alert.  Language is fluent and comprehension intact.  PSYCHOMOTOR SLOWING.   Cranial Nerves: Pupils are equal and reactive to light.  Visual fields are full to confrontation.  Conjugate eye movements are full and symmetric.  Facial sensation and strength are symmetric. DECR HEARING.  Palate elevated symmetrically and uvula is midline.  Shoulder shrug is symmetric.  Tongue is midline. SOFT VOICE, SLURRED SPEECH. Motor: Normal bulk.  MINIMAL REST TREMOR IN RUE. MOD COGWHEELING AND BRADYKINESIA (LUE > RUE). SIGNIFICANT CONTRACTURES OF BLE HAMSTRINGS AND LEFT LEFT ADDUCTOR Sensory: ABSENT VIB AT TOES AND ANKLES. Coordination: No ataxia or dysmetria on finger-nose or rapid alternating movement testing. Gait and Station: IN [place].  UNABLE TO STAND. STOOPED POSTURE. Reflexes: Deep tendon reflexes in the upper and lower extremity are TRACE and symmetric.   DIAGNOSTIC DATA (LABS, IMAGING, TESTING) - I reviewed patient records, labs, notes, testing and imaging myself where available.  Lab Results  Component Value Date   WBC 6.9 06/02/2014   HGB 13.1 06/02/2014   HCT 38.6* 06/02/2014   MCV 87.7 06/02/2014   PLT 230 06/02/2014      Component Value Date/Time   NA 140 05/31/2014 1240   K 3.8 05/31/2014 1240   CL 105 05/31/2014 1240   CO2 23 05/31/2014 1240   GLUCOSE 105* 05/31/2014 1240   BUN 15 05/31/2014 1240   CREATININE 0.60 05/31/2014 1240   CALCIUM 8.8 05/31/2014 1240   PROT 6.2 05/31/2014 1240   ALBUMIN 3.0* 05/31/2014 1240   AST  29 05/31/2014 1240   ALT <5 05/31/2014 1240   ALKPHOS 50 05/31/2014 1240   BILITOT 0.3 05/31/2014 1240   GFRNONAA >90 05/31/2014 1240   GFRAA >90 05/31/2014 1240   No results found for this basename: CHOL,  HDL,  LDLCALC,  LDLDIRECT,  TRIG,  CHOLHDL   No results found for this basename: HGBA1C   Lab Results  Component Value Date   VITAMINB12 752 03/19/2013   Lab Results  Component Value Date   TSH 1.669 03/19/2013     ASSESSMENT AND PLAN  73 y.o. 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 Also with mild dementia and intermittent hallucinations (non-threatening).  PLAN: - continue carbidopa/levodopa 1.5 tabs three times per day (5am, 10am, 4pm).  - Physical therapy.  Return in about 6 months (around 12/31/2014).   Suanne MarkerVIKRAM R. PENUMALLI, MD 07/01/2014, 3:09 PM Certified in Neurology, Neurophysiology and Neuroimaging  [facility] [address], [address] [place], [place] [address] [phone]

## 2014-08-16 ENCOUNTER — Emergency Department (HOSPITAL_COMMUNITY): Payer: Medicare Other

## 2014-08-16 ENCOUNTER — Emergency Department (HOSPITAL_COMMUNITY)
Admission: EM | Admit: 2014-08-16 | Discharge: 2014-08-17 | Disposition: A | Discharge status: Home / Self Care | Payer: Medicare Other | Attending: Emergency Medicine | Admitting: Emergency Medicine

## 2014-08-16 ENCOUNTER — Encounter (HOSPITAL_COMMUNITY): Payer: Self-pay | Admitting: Emergency Medicine

## 2014-08-16 DIAGNOSIS — S80212A Abrasion, left knee, initial encounter: Secondary | ICD-10-CM | POA: Insufficient documentation

## 2014-08-16 DIAGNOSIS — Y929 Unspecified place or not applicable: Secondary | ICD-10-CM | POA: Insufficient documentation

## 2014-08-16 DIAGNOSIS — F0280 Dementia in other diseases classified elsewhere without behavioral disturbance: Secondary | ICD-10-CM | POA: Insufficient documentation

## 2014-08-16 DIAGNOSIS — W19XXXA Unspecified fall, initial encounter: Secondary | ICD-10-CM

## 2014-08-16 DIAGNOSIS — F329 Major depressive disorder, single episode, unspecified: Secondary | ICD-10-CM | POA: Diagnosis not present

## 2014-08-16 DIAGNOSIS — Y998 Other external cause status: Secondary | ICD-10-CM | POA: Insufficient documentation

## 2014-08-16 DIAGNOSIS — Z79899 Other long term (current) drug therapy: Secondary | ICD-10-CM | POA: Diagnosis not present

## 2014-08-16 DIAGNOSIS — Y9389 Activity, other specified: Secondary | ICD-10-CM | POA: Insufficient documentation

## 2014-08-16 DIAGNOSIS — S0990XA Unspecified injury of head, initial encounter: Secondary | ICD-10-CM | POA: Diagnosis present

## 2014-08-16 DIAGNOSIS — G40909 Epilepsy, unspecified, not intractable, without status epilepticus: Secondary | ICD-10-CM | POA: Insufficient documentation

## 2014-08-16 DIAGNOSIS — Z87891 Personal history of nicotine dependence: Secondary | ICD-10-CM | POA: Diagnosis not present

## 2014-08-16 DIAGNOSIS — I10 Essential (primary) hypertension: Secondary | ICD-10-CM | POA: Insufficient documentation

## 2014-08-16 DIAGNOSIS — S0081XA Abrasion of other part of head, initial encounter: Secondary | ICD-10-CM | POA: Diagnosis not present

## 2014-08-16 DIAGNOSIS — G309 Alzheimer's disease, unspecified: Secondary | ICD-10-CM | POA: Diagnosis not present

## 2014-08-16 DIAGNOSIS — Z87448 Personal history of other diseases of urinary system: Secondary | ICD-10-CM | POA: Diagnosis not present

## 2014-08-16 MED ORDER — TETANUS-DIPHTH-ACELL PERTUSSIS 5-2.5-18.5 LF-MCG/0.5 IM SUSP
0.5000 mL | Freq: Once | INTRAMUSCULAR | Status: AC
  Administered 2014-08-16: 0.5 mL via INTRAMUSCULAR
  Filled 2014-08-16: qty 0.5

## 2014-08-16 NOTE — ED Notes (Signed)
ptar called by Diplomatic Services operational officersecretary.

## 2014-08-16 NOTE — Discharge Instructions (Signed)

## 2014-08-16 NOTE — ED Provider Notes (Signed)
CSN: [id number]     Arrival date & time 08/16/14  2049 History   First MD Initiated Contact with Patient 08/16/14 2116     Chief Complaint  Patient presents with  . Fall     (Consider location/radiation/quality/duration/timing/severity/associated sxs/prior Treatment) Patient is a 73 y.o. male presenting with fall. The history is provided by the EMS personnel.  Fall This is a new problem. The current episode started 1 to 2 hours ago. The problem occurs every several days. The problem has not changed since onset.Pertinent negatives include no chest pain and no abdominal pain. Nothing aggravates the symptoms. Nothing relieves the symptoms. He has tried nothing for the symptoms. The treatment provided no relief.    Past Medical History  Diagnosis Date  . Parkinson disease   . Seizures   . Depression   . Hypertension   . Dementia   . UTI (lower urinary tract infection)    Past Surgical History  Procedure Laterality Date  . Hip fracture surgery     Family History  Problem Relation Age of Onset  . Pancreatic cancer Mother   . Heart attack Father    History  Substance Use Topics  . Smoking status: Former Smoker    Quit date: 03/20/1963  . Smokeless tobacco: Never Used  . Alcohol Use: Yes    Review of Systems  Unable to perform ROS: Dementia  Cardiovascular: Negative for chest pain.  Gastrointestinal: Negative for abdominal pain.      Allergies  Review of patient's allergies indicates no known allergies.  Home Medications   Prior to Admission medications   Medication Sig Start Date End Date Taking? Authorizing Provider  carbidopa-levodopa (SINEMET IR) 25-100 MG per tablet Take 1.5 tablets by mouth 3 (three) times daily. At 5am, 10am, 4pm 09/22/13  Yes Suanne MarkerVikram R Penumalli, MD  docusate sodium (COLACE) 100 MG capsule Take 100 mg by mouth every morning.    Yes Historical Provider, MD  donepezil (ARICEPT) 5 MG tablet Take 5 mg by mouth at bedtime.   Yes Historical Provider,  MD  escitalopram (LEXAPRO) 20 MG tablet Take 1 tablet (20 mg total) by mouth every morning. 06/02/14  Yes Ripudeep K Rai, MD  fluocinonide cream (LIDEX) 0.05 % Apply 1 application topically daily.    Yes Historical Provider, MD  furosemide (LASIX) 20 MG tablet Take 1 tablet by mouth daily. 08/18/13  Yes Historical Provider, MD  hydrochlorothiazide (HYDRODIURIL) 25 MG tablet Take 12.5 mg by mouth every morning.    Yes Historical Provider, MD  Infant Care Products (BABY SHAMPOO EX) Apply topically See admin instructions. Provide and wash over eyelids and rinse with warm cloth twice daily   Yes Historical Provider, MD  loratadine (CLARITIN) 10 MG tablet Take 10 mg by mouth daily as needed for allergies.    Yes Historical Provider, MD  LORazepam (ATIVAN) 0.5 MG tablet Take 0.5 tablets (0.25 mg total) by mouth 2 (two) times daily as needed for anxiety. 06/02/14  Yes Ripudeep Jenna LuoK Rai, MD  miconazole (MICOTIN) 2 % cream Apply 1 application topically daily as needed (burning). Apply to buttocks.   Yes Historical Provider, MD  nystatin cream (MYCOSTATIN) Apply 1 application topically 2 (two) times daily. Apply to genital area   Yes Historical Provider, MD  potassium chloride SA (K-DUR,KLOR-CON) 20 MEQ tablet Take 20 mEq by mouth every morning.    Yes Historical Provider, MD  psyllium (REGULOID) 0.52 G capsule Take 0.52 g by mouth every morning.    Yes Historical  Provider, MD  QUEtiapine (SEROQUEL) 50 MG tablet Take 1 tablet (50 mg total) by mouth at bedtime. 06/02/14  Yes Ripudeep Jenna Luo, MD  Skin Protectants, Misc. (BAZA PROTECT EX) Apply 1 application topically daily as needed (burning).    Yes Historical Provider, MD  ciprofloxacin (CIPRO) 500 MG tablet Take 1 tablet (500 mg total) by mouth 2 (two) times daily. X 2 days Patient not taking: Reported on 08/16/2014 06/02/14   Ripudeep K Rai, MD   BP 123/58 mmHg  Pulse 51  Resp 16  SpO2 97% Physical Exam  Constitutional: He appears well-developed and  well-nourished. No distress.  HENT:  Head: Normocephalic and atraumatic.  Mouth/Throat: No oropharyngeal exudate.  Eyes: EOM are normal. Pupils are equal, round, and reactive to light.  Neck: Normal range of motion. Neck supple.  Cardiovascular: Normal rate and regular rhythm.  Exam reveals no friction rub.   No murmur heard. Pulmonary/Chest: Effort normal and breath sounds normal. No respiratory distress. He has no wheezes. He has no rales.  Abdominal: Soft. He exhibits no distension. There is no tenderness. There is no rebound.  Musculoskeletal: Normal range of motion. He exhibits no edema.       Legs: Neurological: He is alert.  Skin: No rash noted. He is not diaphoretic.  Nursing note and vitals reviewed.   ED Course  Procedures (including critical care time) Labs Review Labs Reviewed - No data to display  Imaging Review Ct Head Wo Contrast  08/16/2014   CLINICAL DATA:  Unwitnessed fall?LOC.  Redness on left forehead  EXAM: CT HEAD WITHOUT CONTRAST  CT CERVICAL SPINE WITHOUT CONTRAST  TECHNIQUE: Multidetector CT imaging of the head and cervical spine was performed following the standard protocol without intravenous contrast. Multiplanar CT image reconstructions of the cervical spine were also generated.  COMPARISON:  08/17/2013.  FINDINGS: CT HEAD FINDINGS  Ventricles normal configuration. There is ventricular and sulcal enlargement reflecting moderate atrophy. No parenchymal masses or mass effect. No evidence of a cortical infarct. There are no extra-axial masses or abnormal fluid collections.  No intracranial hemorrhage.  Sinuses and mastoid air cells are essentially clear. No skull fracture.  CT CERVICAL SPINE FINDINGS  No fracture. No spondylolisthesis. Mild loss discitis C4-C5 and C5-C6 with moderate loss of disc height at C6-C7. Mild facet degenerative changes noted at multiple levels bilaterally. No significant stenosis. No evidence of a disc herniation.  Soft tissues are  unremarkable.  Lung apices are clear.  IMPRESSION: HEAD CT:  No acute intracranial abnormalities.  No skull fracture.  CERVICAL CT:  No fracture or acute finding.   Electronically Signed   By: Amie Portland M.D.   On: 08/16/2014 22:46   Ct Cervical Spine Wo Contrast  08/16/2014   CLINICAL DATA:  Unwitnessed fall?LOC.  Redness on left forehead  EXAM: CT HEAD WITHOUT CONTRAST  CT CERVICAL SPINE WITHOUT CONTRAST  TECHNIQUE: Multidetector CT imaging of the head and cervical spine was performed following the standard protocol without intravenous contrast. Multiplanar CT image reconstructions of the cervical spine were also generated.  COMPARISON:  08/17/2013.  FINDINGS: CT HEAD FINDINGS  Ventricles normal configuration. There is ventricular and sulcal enlargement reflecting moderate atrophy. No parenchymal masses or mass effect. No evidence of a cortical infarct. There are no extra-axial masses or abnormal fluid collections.  No intracranial hemorrhage.  Sinuses and mastoid air cells are essentially clear. No skull fracture.  CT CERVICAL SPINE FINDINGS  No fracture. No spondylolisthesis. Mild loss discitis C4-C5 and C5-C6 with  moderate loss of disc height at C6-C7. Mild facet degenerative changes noted at multiple levels bilaterally. No significant stenosis. No evidence of a disc herniation.  Soft tissues are unremarkable.  Lung apices are clear.  IMPRESSION: HEAD CT:  No acute intracranial abnormalities.  No skull fracture.  CERVICAL CT:  No fracture or acute finding.   Electronically Signed   By: Amie Portlandavid  Ormond M.D.   On: 08/16/2014 22:46   Dg Shoulder Left  08/16/2014   CLINICAL DATA:  Larey SeatFell today. Left ankle and left shoulder pain with abrasions.  EXAM: LEFT SHOULDER - 2+ VIEW  COMPARISON:  None.  FINDINGS: Degenerative changes in the glenohumeral joint with joint space narrowing and prominent osteophytes off of the inferior glenoid. No evidence of acute fracture or dislocation.  IMPRESSION: Degenerative changes  in the glenohumeral joint.  No acute fractures.   Electronically Signed   By: Burman NievesWilliam  Stevens M.D.   On: 08/16/2014 23:01   Dg Knee Complete 4 Views Left  08/16/2014   CLINICAL DATA:  Fall todayLeft ankle and left shoulder pain.Abrasion to ;eft anterior knee. Pt unable to extend legs; legs contracted; pt does not walk.Bruising to lateral left shoulder.  EXAM: LEFT KNEE - COMPLETE 4+ VIEW  COMPARISON:  08/17/2013.  FINDINGS: No convincing fracture. No bone lesion. Bones are diffusely demineralized. Knee joint is normally aligned. No joint effusion.  IMPRESSION: No fracture or dislocation.  Stable appearance from the prior study.   Electronically Signed   By: Amie Portlandavid  Ormond M.D.   On: 08/16/2014 23:00     EKG Interpretation None      MDM   Final diagnoses:  Fall  Abrasion of forehead, initial encounter  Knee abrasion, left, initial encounter    49M with dementia presents after unwitnessed fall at a nursing home. Demented with Parkinson's. Unwitnessed fall out of his wheelchair, no LOC. Patient here at baseline. Large skin tear on his knee, abrasion on his forehead. Tetanus updated. Imaging negative. Stable for discharge.   Elwin MochaBlair Cardell Rachel, MD 08/17/14 [phone]

## 2014-08-16 NOTE — ED Notes (Signed)
Pt placed in gown. Monitored by pulse ox, bp cuff, and 12-lead. 

## 2014-08-16 NOTE — ED Notes (Signed)
Per EMS, the patient is from [facility], and stays in the alzheimer's unit.  He is wheelchair bound, and had an unwitnessed fall from wheelchair today.  No change in baseline mental status, no complaints of neck pain. Abrasion present on left forehead and left knee skin tear.  Facility sent here for evaluation. bp 168/88, p 74, normal sinus, rr 18, cbg 185.

## 2014-08-17 NOTE — ED Notes (Signed)
Wound care and dry dressings applied to left forehead and left knee.

## 2014-10-26 ENCOUNTER — Encounter (HOSPITAL_COMMUNITY): Payer: Self-pay | Admitting: *Deleted

## 2014-10-26 ENCOUNTER — Emergency Department (HOSPITAL_COMMUNITY)
Admission: EM | Admit: 2014-10-26 | Discharge: 2014-10-27 | Disposition: A | Discharge status: Home / Self Care | Payer: Medicare Other | Attending: Emergency Medicine | Admitting: Emergency Medicine

## 2014-10-26 ENCOUNTER — Emergency Department (HOSPITAL_COMMUNITY): Payer: Medicare Other

## 2014-10-26 DIAGNOSIS — G20 Parkinson's disease: Secondary | ICD-10-CM | POA: Insufficient documentation

## 2014-10-26 DIAGNOSIS — I10 Essential (primary) hypertension: Secondary | ICD-10-CM | POA: Diagnosis not present

## 2014-10-26 DIAGNOSIS — Y9389 Activity, other specified: Secondary | ICD-10-CM | POA: Insufficient documentation

## 2014-10-26 DIAGNOSIS — W19XXXA Unspecified fall, initial encounter: Secondary | ICD-10-CM | POA: Diagnosis not present

## 2014-10-26 DIAGNOSIS — Y998 Other external cause status: Secondary | ICD-10-CM | POA: Insufficient documentation

## 2014-10-26 DIAGNOSIS — F0390 Unspecified dementia without behavioral disturbance: Secondary | ICD-10-CM | POA: Insufficient documentation

## 2014-10-26 DIAGNOSIS — N390 Urinary tract infection, site not specified: Secondary | ICD-10-CM | POA: Diagnosis not present

## 2014-10-26 DIAGNOSIS — F329 Major depressive disorder, single episode, unspecified: Secondary | ICD-10-CM | POA: Insufficient documentation

## 2014-10-26 DIAGNOSIS — Z79899 Other long term (current) drug therapy: Secondary | ICD-10-CM | POA: Diagnosis not present

## 2014-10-26 DIAGNOSIS — Y9289 Other specified places as the place of occurrence of the external cause: Secondary | ICD-10-CM | POA: Insufficient documentation

## 2014-10-26 DIAGNOSIS — Z87891 Personal history of nicotine dependence: Secondary | ICD-10-CM | POA: Diagnosis not present

## 2014-10-26 DIAGNOSIS — Z043 Encounter for examination and observation following other accident: Secondary | ICD-10-CM | POA: Diagnosis present

## 2014-10-26 LAB — URINALYSIS, ROUTINE W REFLEX MICROSCOPIC
Bilirubin Urine: NEGATIVE
GLUCOSE, UA: NEGATIVE mg/dL
Ketones, ur: 15 mg/dL — AB
Nitrite: POSITIVE — AB
PH: 5.5 (ref 5.0–8.0)
Protein, ur: NEGATIVE mg/dL
SPECIFIC GRAVITY, URINE: 1.025 (ref 1.005–1.030)
UROBILINOGEN UA: 1 mg/dL (ref 0.0–1.0)

## 2014-10-26 LAB — CBC WITH DIFFERENTIAL/PLATELET
Basophils Absolute: 0 10*3/uL (ref 0.0–0.1)
Basophils Relative: 0 % (ref 0–1)
Eosinophils Absolute: 0.2 10*3/uL (ref 0.0–0.7)
Eosinophils Relative: 2 % (ref 0–5)
HCT: 40.5 % (ref 39.0–52.0)
HEMOGLOBIN: 13.9 g/dL (ref 13.0–17.0)
LYMPHS ABS: 1 10*3/uL (ref 0.7–4.0)
Lymphocytes Relative: 14 % (ref 12–46)
MCH: 31 pg (ref 26.0–34.0)
MCHC: 34.3 g/dL (ref 30.0–36.0)
MCV: 90.2 fL (ref 78.0–100.0)
MONOS PCT: 9 % (ref 3–12)
Monocytes Absolute: 0.7 10*3/uL (ref 0.1–1.0)
NEUTROS ABS: 5.3 10*3/uL (ref 1.7–7.7)
Neutrophils Relative %: 75 % (ref 43–77)
PLATELETS: 176 10*3/uL (ref 150–400)
RBC: 4.49 MIL/uL (ref 4.22–5.81)
RDW: 12.6 % (ref 11.5–15.5)
WBC: 7.1 10*3/uL (ref 4.0–10.5)

## 2014-10-26 LAB — COMPREHENSIVE METABOLIC PANEL
ALK PHOS: 50 U/L (ref 39–117)
ALT: 8 U/L (ref 0–53)
AST: 19 U/L (ref 0–37)
Albumin: 3.6 g/dL (ref 3.5–5.2)
Anion gap: 5 (ref 5–15)
BUN: 21 mg/dL (ref 6–23)
CHLORIDE: 105 mmol/L (ref 96–112)
CO2: 29 mmol/L (ref 19–32)
Calcium: 9.1 mg/dL (ref 8.4–10.5)
Creatinine, Ser: 0.79 mg/dL (ref 0.50–1.35)
GFR calc non Af Amer: 86 mL/min — ABNORMAL LOW (ref >90)
GLUCOSE: 103 mg/dL — AB (ref 70–99)
POTASSIUM: 3.6 mmol/L (ref 3.5–5.1)
Sodium: 139 mmol/L (ref 135–145)
Total Bilirubin: 0.6 mg/dL (ref 0.3–1.2)
Total Protein: 6.3 g/dL (ref 6.0–8.3)

## 2014-10-26 LAB — URINE MICROSCOPIC-ADD ON

## 2014-10-26 LAB — PROTIME-INR
INR: 1 (ref 0.00–1.49)
Prothrombin Time: 13.3 seconds (ref 11.6–15.2)

## 2014-10-26 LAB — I-STAT TROPONIN, ED: Troponin i, poc: 0.01 ng/mL (ref 0.00–0.08)

## 2014-10-26 LAB — I-STAT CG4 LACTIC ACID, ED: Lactic Acid, Venous: 0.71 mmol/L (ref 0.5–2.0)

## 2014-10-26 MED ORDER — SODIUM CHLORIDE 0.9 % IV BOLUS (SEPSIS)
1000.0000 mL | Freq: Once | INTRAVENOUS | Status: AC
  Administered 2014-10-26: 1000 mL via INTRAVENOUS

## 2014-10-26 MED ORDER — CEFTRIAXONE SODIUM 1 G IJ SOLR
1.0000 g | Freq: Once | INTRAMUSCULAR | Status: AC
  Administered 2014-10-26: 1 g via INTRAVENOUS
  Filled 2014-10-26: qty 10

## 2014-10-26 NOTE — ED Notes (Signed)
Pt in via EMS from [facility] nursing facility, pt was found laying on his right side in the floor approx 1900, unwitnessed fall, last seen at 1730 per nursing staff, pt denies complaints, pt answers questions at baseline but staff at nursing home reported that patient speech was not as clear as normal, pt with history of dementia, no injuries noted, pt alert, CBG 134, VSS during transport

## 2014-10-26 NOTE — ED Provider Notes (Signed)
CSN: [id number]     Arrival date & time 10/26/14  1940 History   First MD Initiated Contact with Patient 10/26/14 2014     Chief Complaint  Patient presents with  . Fall    (Consider location/radiation/quality/duration/timing/severity/associated sxs/prior Treatment) HPI Comments: Patient is a 74 year old male with history of Parkinson's dementia, seizures, depression, and hypertension. He presents to the emergency department from [facility] nursing facility. Patient was last seen at 1730, per nursing staff. He was next found lying on the floor on his right side at approximately 1900. Patient denies any recollection of a fall. He also denies any pain at present. Nursing staff at [facility] claim that patient's speech has been more slurred than his baseline. He was noted to have slurred speech during his neurology appt with Dr. Marjory Lies in 06/2014.  Level V caveat applies.  Patient is a 74 y.o. male presenting with fall. The history is provided by the patient. No language interpreter was used.  Fall    Past Medical History  Diagnosis Date  . Parkinson disease   . Seizures   . Depression   . Hypertension   . Dementia   . UTI (lower urinary tract infection)    Past Surgical History  Procedure Laterality Date  . Hip fracture surgery     Family History  Problem Relation Age of Onset  . Pancreatic cancer Mother   . Heart attack Father    History  Substance Use Topics  . Smoking status: Former Smoker    Quit date: 03/20/1963  . Smokeless tobacco: Never Used  . Alcohol Use: Yes    Review of Systems  Unable to perform ROS: Dementia  Patient does deny pain complaints   Allergies  Review of patient's allergies indicates no known allergies.  Home Medications   Prior to Admission medications   Medication Sig Start Date End Date Taking? Authorizing Provider  carbidopa-levodopa (SINEMET IR) 25-100 MG per tablet Take 1.5 tablets by mouth 3 (three) times daily. At 5am, 10am,  4pm 09/22/13  Yes Suanne Marker, MD  docusate sodium (COLACE) 100 MG capsule Take 100 mg by mouth every morning.    Yes Historical Provider, MD  donepezil (ARICEPT) 10 MG tablet Take 10 mg by mouth at bedtime.   Yes Historical Provider, MD  escitalopram (LEXAPRO) 20 MG tablet Take 1 tablet (20 mg total) by mouth every morning. 06/02/14  Yes Ripudeep K Rai, MD  fluocinonide ointment (LIDEX) 0.05 % Apply 1 application topically daily.   Yes Historical Provider, MD  furosemide (LASIX) 20 MG tablet Take 10 mg by mouth daily.  08/18/13  Yes Historical Provider, MD  hydrochlorothiazide (HYDRODIURIL) 25 MG tablet Take 12.5 mg by mouth every morning.    Yes Historical Provider, MD  Infant Care Products (BABY SHAMPOO EX) Apply topically See admin instructions. Provide and wash over eyelids and rinse with warm cloth twice daily   Yes Historical Provider, MD  loratadine (CLARITIN) 10 MG tablet Take 10 mg by mouth daily as needed for allergies.    Yes Historical Provider, MD  LORazepam (ATIVAN) 0.5 MG tablet Take 0.5 tablets (0.25 mg total) by mouth 2 (two) times daily as needed for anxiety. 06/02/14  Yes Ripudeep Jenna Luo, MD  miconazole (MICOTIN) 2 % cream Apply 1 application topically daily as needed (burning). Apply to buttocks.   Yes Historical Provider, MD  nystatin cream (MYCOSTATIN) Apply 1 application topically 2 (two) times daily. Apply to genital area   Yes Historical Provider,  MD  potassium chloride SA (K-DUR,KLOR-CON) 20 MEQ tablet Take 20 mEq by mouth every morning.    Yes Historical Provider, MD  psyllium (REGULOID) 0.52 G capsule Take 0.52 g by mouth every morning.    Yes Historical Provider, MD  QUEtiapine (SEROQUEL) 50 MG tablet Take 1 tablet (50 mg total) by mouth at bedtime. 06/02/14  Yes Ripudeep Jenna Luo, MD  Skin Protectants, Misc. (BAZA PROTECT EX) Apply 1 application topically as needed (for burning).   Yes Historical Provider, MD  ciprofloxacin (CIPRO) 500 MG tablet Take 1 tablet (500 mg  total) by mouth 2 (two) times daily. X 2 days Patient not taking: Reported on 08/16/2014 06/02/14   Ripudeep K Rai, MD   BP 111/77 mmHg  Pulse 77  Temp(Src) 96 F (35.6 C) (Rectal)  Resp 22  SpO2 99%   Physical Exam  Constitutional: He appears well-developed and well-nourished. No distress.  Nontoxic/nonseptic appearing  HENT:  Head: Normocephalic and atraumatic.  Eyes: Conjunctivae and EOM are normal. No scleral icterus.  Neck: Normal range of motion.  Cardiovascular: Normal rate, regular rhythm and intact distal pulses.   Pulmonary/Chest: Effort normal and breath sounds normal. No respiratory distress. He has no wheezes. He has no rales.  Respirations even and unlabored.   Abdominal: Soft. He exhibits no distension. There is no tenderness.  No palpable tenderness. No masses.  Musculoskeletal: Normal range of motion.  Neurological: He is alert.  Patient alert and oriented. He answers some questions appropriately with slurred speech and follows all commands. No facial drooping noted. Eyebrow raise symmetric. Tongue midline. Equal grip strength b/l with 5/5 strength against resistance in all major muscle groups b/l. No pronator drift noted.  Skin: Skin is warm and dry. No rash noted. He is not diaphoretic. No erythema. No pallor.  Psychiatric: His speech is slurred.  Speech is slurred; unable to assess whether this is his baseline when reviewing prior notes.  Nursing note and vitals reviewed.   ED Course  Procedures (including critical care time) Labs Review Labs Reviewed  COMPREHENSIVE METABOLIC PANEL - Abnormal; Notable for the following:    Glucose, Bld 103 (*)    GFR calc non Af Amer 86 (*)    All other components within normal limits  URINALYSIS, ROUTINE W REFLEX MICROSCOPIC - Abnormal; Notable for the following:    Color, Urine AMBER (*)    APPearance CLOUDY (*)    Hgb urine dipstick MODERATE (*)    Ketones, ur 15 (*)    Nitrite POSITIVE (*)    Leukocytes, UA MODERATE  (*)    All other components within normal limits  URINE MICROSCOPIC-ADD ON - Abnormal; Notable for the following:    Bacteria, UA MANY (*)    All other components within normal limits  URINE CULTURE  CBC WITH DIFFERENTIAL/PLATELET  PROTIME-INR  I-STAT TROPOININ, ED  I-STAT CG4 LACTIC ACID, ED    Imaging Review Dg Pelvis 1-2 Views  10/26/2014   CLINICAL DATA:  Larey Seat  EXAM: PELVIS - 1-2 VIEW  COMPARISON:  None.  FINDINGS: Study is limited due to inability of the patient to cooperate with positioning. There is an old compression screw left hip fixation. No acute fracture is evident. Poor bone density will decrease sensitivity for detection of a nondisplaced fracture. There is no hip dislocation.  IMPRESSION: Negative limited study.   Electronically Signed   By: Ellery Plunk M.D.   On: 10/26/2014 21:16   Ct Head Wo Contrast  10/26/2014   ADDENDUM  REPORT: 10/26/2014 21:29  ADDENDUM: The impression should read, "2. Negative for acute cervical spine fracture."   Electronically Signed   By: Ellery Plunkaniel R Mitchell M.D.   On: 10/26/2014 21:29   10/26/2014   CLINICAL DATA:  Unwitnessed fall, found lying on his right side on the floor 2 hours ago.  EXAM: CT HEAD WITHOUT CONTRAST  CT CERVICAL SPINE WITHOUT CONTRAST  TECHNIQUE: Multidetector CT imaging of the head and cervical spine was performed following the standard protocol without intravenous contrast. Multiplanar CT image reconstructions of the cervical spine were also generated.  COMPARISON:  None.  08/16/2014  FINDINGS: CT HEAD FINDINGS  There is no intracranial hemorrhage or extra-axial fluid collection. There is moderate generalized atrophy and chronic microvascular ischemic disease. There is no evidence of acute infarction. The calvarium and skullbase are intact.  CT CERVICAL SPINE FINDINGS  The vertebral column, pedicles and facet articulations are intact. There is no evidence of acute fracture. No acute soft tissue abnormalities are evident. Moderate  degenerative disc changes are present, greatest at C6-7.  IMPRESSION: 1. No acute intracranial traumatic injury. Moderate generalized atrophy and chronic microvascular ischemia. 2. Air for acute cervical spine fracture  Electronically Signed: By: Ellery Plunkaniel R Mitchell M.D. On: 10/26/2014 21:13   Ct Cervical Spine Wo Contrast  10/26/2014   ADDENDUM REPORT: 10/26/2014 21:29  ADDENDUM: The impression should read, "2. Negative for acute cervical spine fracture."   Electronically Signed   By: Ellery Plunkaniel R Mitchell M.D.   On: 10/26/2014 21:29   10/26/2014   CLINICAL DATA:  Unwitnessed fall, found lying on his right side on the floor 2 hours ago.  EXAM: CT HEAD WITHOUT CONTRAST  CT CERVICAL SPINE WITHOUT CONTRAST  TECHNIQUE: Multidetector CT imaging of the head and cervical spine was performed following the standard protocol without intravenous contrast. Multiplanar CT image reconstructions of the cervical spine were also generated.  COMPARISON:  None.  08/16/2014  FINDINGS: CT HEAD FINDINGS  There is no intracranial hemorrhage or extra-axial fluid collection. There is moderate generalized atrophy and chronic microvascular ischemic disease. There is no evidence of acute infarction. The calvarium and skullbase are intact.  CT CERVICAL SPINE FINDINGS  The vertebral column, pedicles and facet articulations are intact. There is no evidence of acute fracture. No acute soft tissue abnormalities are evident. Moderate degenerative disc changes are present, greatest at C6-7.  IMPRESSION: 1. No acute intracranial traumatic injury. Moderate generalized atrophy and chronic microvascular ischemia. 2. Air for acute cervical spine fracture  Electronically Signed: By: Ellery Plunkaniel R Mitchell M.D. On: 10/26/2014 21:13     EKG Interpretation   Date/Time:  Tuesday October 26 2014 19:47:25 EST Ventricular Rate:  64 PR Interval:  56 QRS Duration: 129 QT Interval:  470 QTC Calculation: 485 R Axis:   8 Text Interpretation:  Normal sinus  rhythm Short PR interval Probable left  atrial enlargement IVCD, consider atypical RBBB no significant change  since 2015 Confirmed by GOLDSTON  MD, SCOTT ([id number]) on 10/26/2014 8:15:07 PM      MDM   Final diagnoses:  UTI (lower urinary tract infection)    2030 - Patient presenting from [facility] after possible fall with worsening of slurred speech. Level 5 caveat. No evidence of trauma on exam. Speech is noted to be slurred without other neurologic deficits. Will initiate labs and imaging for further work up.  2120 - Labs reassuring. No leukocytosis. UA pending. Imaging negative for acute findings. In and out cath ordered to obtain urine.  2145 - Spoke  with son about patient's Parkinson's disease progression. Son states that patient has good days and bad days with his speech. Son reports hearing slurred speech from the patient in the past. He states that there are also weeks where he speaks very fluently. Son reports father being aware of his intermittent slurred speech. He also reports that patient has been worked up for a stroke in the past, but results have pointed to a progression of his Parkinson's dementia. Son expresses desire about getting patient back to his nursing facility as soon as possible as being in an unfamiliar setting often makes the patient very depressed and confused. Patient also noted to be hypothermic by nursing staff. Verbally acknowledged and relayed to tech to add warm blankets.  2245 - Patient remains hypothermic. Ordered bear hugger. UA positive for UTI. Patient otherwise hemodynamically stable. No hypotension. Plan to discuss observation admission for tx of UTI given persistent hypothermia despite application of warm blankets. Initial dosing of Rocephin ordered.  2310 - Case discussed with Dr. Alvester Morin. He will come evaluate the patient for obs admit.  2324 - Dr. Alvester Morin has evaluated the patient. He reports temp at bedside was 97.65F. Will recheck in 30 min with plan  to discharge if temperature stable; otherwise, will proceed with admission.  0025 - Temperature stable on recheck. Plan to d/c back to [facility] with Rx for Keflex.  Antony Madura, PA-C 10/27/14 1610  Audree Camel, MD 11/04/14 [phone]

## 2014-10-26 NOTE — ED Notes (Signed)
Pt taken to CT.

## 2014-10-26 NOTE — ED Notes (Signed)
Notified PA that pt had left facial droop and slurred speech.

## 2014-10-27 DIAGNOSIS — N390 Urinary tract infection, site not specified: Secondary | ICD-10-CM | POA: Diagnosis not present

## 2014-10-27 MED ORDER — CEPHALEXIN 500 MG PO CAPS: 500.0000 mg | ORAL_CAPSULE | Freq: Two times a day (BID) | ORAL | Status: DC

## 2014-10-27 NOTE — Discharge Instructions (Signed)

## 2014-10-27 NOTE — Consult Note (Signed)
Hospitalist Consult Note   Patient name: Joseph [name] Medical record number: [id number] Date of birth: Jul 27, 1941 Age: 74 y.o. Gender: male  Primary Care Provider: Audree Bane, DO  Chief Complaint: ? Hypothermia, UTI, fall  History of Present Illness:This is a 74 y.o. year old male with significant past medical history of parkinsons disease, dementia, HTN presenting with ? Hypothermia, UTI, fall. Pt resident of local SNF. Per report, pt found down in room for approx 90 mins. On observation, per report, pt w/ worsened baseline dysarthria-though relatively unsure. No reported head trauma. No fever. Pt brought to the ER for further evaluation.  Presented to ER w/ T 96-improved to 97.5 w/ 1-2 blankets. Workup including CXR, CT head and C spine WNL apart from UA indicative of UTI. EDPA was initially desiring admission for ? Hypothermia. However T has normalized to 97.5 rectally w/ 1-2 blankets. Per EDPA, pt's son and pt desire pt to go back to facility if at all possible.    Assessment and Plan: Joseph [name] is a 74 y.o. year old male presenting with ? Hypothermia, UTI, fall   Active Problems:   Fall   1- ? Hypothermia -normalized at bedside w/ 1-2 blankets  -discussed w/ EDPA  -will observe pt 1-2 hours w/ trace blankets and recheck temp  -if temp of 96 recurs, will admit for observation   2- UTI  -treat per EDPA plan w/ rocephin -urine cx  3- Fall  -recurrent issue in setting of parkinson -confusion appears to be at baseline per report    Patient Active Problem List   Diagnosis Date Noted  . Fall 10/26/2014  . Encephalopathy 05/27/2014  . UTI (lower urinary tract infection) 05/27/2014  . Fever 05/27/2014  . Acute encephalopathy 03/20/2013  . Metabolic encephalopathy 03/19/2013  . Diastolic heart failure 06/06/2012  . Hypertension 05/29/2012  . Dementia in Parkinson's disease 05/29/2012  . Dementia with behavioral disturbance 05/29/2012  . Parkinson disease, symptomatic  05/26/2012  . Dementia 05/26/2012  . Bradycardia 05/26/2012  . PVC's (premature ventricular contractions) 05/23/2012  . Bigeminy 05/23/2012   Past Medical History: Past Medical History  Diagnosis Date  . Parkinson disease   . Seizures   . Depression   . Hypertension   . Dementia   . UTI (lower urinary tract infection)     Past Surgical History: Past Surgical History  Procedure Laterality Date  . Hip fracture surgery      Social History: History   Social History  . Marital Status: Widowed    Spouse Name: N/A  . Number of Children: 2  . Years of Education: HS   Occupational History  . retired    Social History Main Topics  . Smoking status: Former Smoker    Quit date: 03/20/1963  . Smokeless tobacco: Never Used  . Alcohol Use: Yes  . Drug Use: No  . Sexual Activity: No   Other Topics Concern  . None   Social History Narrative   Patient lives at home at [facility].   Caffeine Use: 2-3 cups daily    Family History: Family History  Problem Relation Age of Onset  . Pancreatic cancer Mother   . Heart attack Father     Allergies: No Known Allergies  No current facility-administered medications for this encounter.   Current Outpatient Prescriptions  Medication Sig Dispense Refill  . carbidopa-levodopa (SINEMET IR) 25-100 MG per tablet Take 1.5 tablets by mouth 3 (three) times daily. At 5am, 10am, 4pm 135 tablet 1  .  docusate sodium (COLACE) 100 MG capsule Take 100 mg by mouth every morning.     . donepezil (ARICEPT) 10 MG tablet Take 10 mg by mouth at bedtime.    Marland Kitchen. escitalopram (LEXAPRO) 20 MG tablet Take 1 tablet (20 mg total) by mouth every morning. 30 tablet 0  . fluocinonide ointment (LIDEX) 0.05 % Apply 1 application topically daily.    . furosemide (LASIX) 20 MG tablet Take 10 mg by mouth daily.     . hydrochlorothiazide (HYDRODIURIL) 25 MG tablet Take 12.5 mg by mouth every morning.     . Infant Care Products (BABY SHAMPOO EX) Apply topically  See admin instructions. Provide and wash over eyelids and rinse with warm cloth twice daily    . loratadine (CLARITIN) 10 MG tablet Take 10 mg by mouth daily as needed for allergies.     Marland Kitchen. LORazepam (ATIVAN) 0.5 MG tablet Take 0.5 tablets (0.25 mg total) by mouth 2 (two) times daily as needed for anxiety. 30 tablet 0  . miconazole (MICOTIN) 2 % cream Apply 1 application topically daily as needed (burning). Apply to buttocks.    . nystatin cream (MYCOSTATIN) Apply 1 application topically 2 (two) times daily. Apply to genital area    . potassium chloride SA (K-DUR,KLOR-CON) 20 MEQ tablet Take 20 mEq by mouth every morning.     . psyllium (REGULOID) 0.52 G capsule Take 0.52 g by mouth every morning.     Marland Kitchen. QUEtiapine (SEROQUEL) 50 MG tablet Take 1 tablet (50 mg total) by mouth at bedtime. 30 tablet 0  . Skin Protectants, Misc. (BAZA PROTECT EX) Apply 1 application topically as needed (for burning).    . ciprofloxacin (CIPRO) 500 MG tablet Take 1 tablet (500 mg total) by mouth 2 (two) times daily. X 2 days (Patient not taking: Reported on 08/16/2014) 4 tablet 0   Review Of Systems: 12 point ROS negative except as noted above in HPI.  Physical Exam: Filed Vitals:   10/26/14 2325  BP:   Pulse:   Temp: 97.5 F (36.4 C)  Resp:     General: cooperative and dysarthric HEENT: PERRLA and extra ocular movement intact Heart: S1, S2 normal, no murmur, rub or gallop, regular rate and rhythm Lungs: clear to auscultation, no wheezes or rales and unlabored breathing Abdomen: abdomen is soft without significant tenderness, masses, organomegaly or guarding Extremities: extremities normal, atraumatic, no cyanosis or edema Skin:no rashes Neurology: + generalized weakness   Labs and Imaging: Lab Results  Component Value Date/Time   NA 139 10/26/2014 07:46 PM   K 3.6 10/26/2014 07:46 PM   CL 105 10/26/2014 07:46 PM   CO2 29 10/26/2014 07:46 PM   BUN 21 10/26/2014 07:46 PM   CREATININE 0.79 10/26/2014  07:46 PM   GLUCOSE 103* 10/26/2014 07:46 PM   Lab Results  Component Value Date   WBC 7.1 10/26/2014   HGB 13.9 10/26/2014   HCT 40.5 10/26/2014   MCV 90.2 10/26/2014   PLT 176 10/26/2014   Urinalysis    Component Value Date/Time   COLORURINE AMBER* 10/26/2014 2146   APPEARANCEUR CLOUDY* 10/26/2014 2146   LABSPEC 1.025 10/26/2014 2146   PHURINE 5.5 10/26/2014 2146   GLUCOSEU NEGATIVE 10/26/2014 2146   HGBUR MODERATE* 10/26/2014 2146   BILIRUBINUR NEGATIVE 10/26/2014 2146   KETONESUR 15* 10/26/2014 2146   PROTEINUR NEGATIVE 10/26/2014 2146   UROBILINOGEN 1.0 10/26/2014 2146   NITRITE POSITIVE* 10/26/2014 2146   LEUKOCYTESUR MODERATE* 10/26/2014 2146  Dg Pelvis 1-2 Views  10/26/2014   CLINICAL DATA:  Larey Seat  EXAM: PELVIS - 1-2 VIEW  COMPARISON:  None.  FINDINGS: Study is limited due to inability of the patient to cooperate with positioning. There is an old compression screw left hip fixation. No acute fracture is evident. Poor bone density will decrease sensitivity for detection of a nondisplaced fracture. There is no hip dislocation.  IMPRESSION: Negative limited study.   Electronically Signed   By: Ellery Plunk M.D.   On: 10/26/2014 21:16   Ct Head Wo Contrast  10/26/2014   ADDENDUM REPORT: 10/26/2014 21:29  ADDENDUM: The impression should read, "2. Negative for acute cervical spine fracture."   Electronically Signed   By: Ellery Plunk M.D.   On: 10/26/2014 21:29   10/26/2014   CLINICAL DATA:  Unwitnessed fall, found lying on his right side on the floor 2 hours ago.  EXAM: CT HEAD WITHOUT CONTRAST  CT CERVICAL SPINE WITHOUT CONTRAST  TECHNIQUE: Multidetector CT imaging of the head and cervical spine was performed following the standard protocol without intravenous contrast. Multiplanar CT image reconstructions of the cervical spine were also generated.  COMPARISON:  None.  08/16/2014  FINDINGS: CT HEAD FINDINGS  There is no intracranial hemorrhage or extra-axial fluid  collection. There is moderate generalized atrophy and chronic microvascular ischemic disease. There is no evidence of acute infarction. The calvarium and skullbase are intact.  CT CERVICAL SPINE FINDINGS  The vertebral column, pedicles and facet articulations are intact. There is no evidence of acute fracture. No acute soft tissue abnormalities are evident. Moderate degenerative disc changes are present, greatest at C6-7.  IMPRESSION: 1. No acute intracranial traumatic injury. Moderate generalized atrophy and chronic microvascular ischemia. 2. Air for acute cervical spine fracture  Electronically Signed: By: Ellery Plunk M.D. On: 10/26/2014 21:13   Ct Cervical Spine Wo Contrast  10/26/2014   ADDENDUM REPORT: 10/26/2014 21:29  ADDENDUM: The impression should read, "2. Negative for acute cervical spine fracture."   Electronically Signed   By: Ellery Plunk M.D.   On: 10/26/2014 21:29   10/26/2014   CLINICAL DATA:  Unwitnessed fall, found lying on his right side on the floor 2 hours ago.  EXAM: CT HEAD WITHOUT CONTRAST  CT CERVICAL SPINE WITHOUT CONTRAST  TECHNIQUE: Multidetector CT imaging of the head and cervical spine was performed following the standard protocol without intravenous contrast. Multiplanar CT image reconstructions of the cervical spine were also generated.  COMPARISON:  None.  08/16/2014  FINDINGS: CT HEAD FINDINGS  There is no intracranial hemorrhage or extra-axial fluid collection. There is moderate generalized atrophy and chronic microvascular ischemic disease. There is no evidence of acute infarction. The calvarium and skullbase are intact.  CT CERVICAL SPINE FINDINGS  The vertebral column, pedicles and facet articulations are intact. There is no evidence of acute fracture. No acute soft tissue abnormalities are evident. Moderate degenerative disc changes are present, greatest at C6-7.  IMPRESSION: 1. No acute intracranial traumatic injury. Moderate generalized atrophy and chronic  microvascular ischemia. 2. Air for acute cervical spine fracture  Electronically Signed: By: Ellery Plunk M.D. On: 10/26/2014 21:13           Doree Albee MD  Pager: [phone]

## 2014-10-27 NOTE — ED Notes (Signed)
Pt made aware to return if symptoms worsen or if any life threatening symptoms occur.   

## 2014-10-27 NOTE — ED Notes (Signed)
Notified [facility] for transportation back home 

## 2014-10-29 LAB — URINE CULTURE

## 2014-11-01 ENCOUNTER — Telehealth (HOSPITAL_COMMUNITY): Payer: Self-pay

## 2014-11-01 NOTE — Telephone Encounter (Signed)
Post ED Visit - Positive Culture Follow-up  Culture report reviewed by antimicrobial stewardship pharmacist: []  Wes Dulaney, Pharm.D., BCPS []  Celedonio MiyamotoJeremy Frens, Pharm.D., BCPS []  Georgina PillionElizabeth Martin, [address] BCPS []  OglethorpeMinh Pham, [address]Ph[address] BCPS, AAHIVP [x]  Estella HuskMichelle Turner, Pharm.D., BCPS, AAHIVP []  Elder CyphersLorie Poole, [address]Ph[address] BCPS  Positive Urine culture, 80,000 colonies -> E Coli Treated with Cephalexin, organism sensitive to the same and no further patient follow-up is required at this time.  Arvid RightClark, Berdina Cheever Dorn 11/01/2014, 5:48 AM

## 2014-12-30 ENCOUNTER — Ambulatory Visit (INDEPENDENT_AMBULATORY_CARE_PROVIDER_SITE_OTHER): Payer: Medicare Other | Admitting: Diagnostic Neuroimaging

## 2014-12-30 ENCOUNTER — Encounter: Payer: Self-pay | Admitting: Diagnostic Neuroimaging

## 2014-12-30 VITALS — BP 115/62 | HR 58

## 2014-12-30 DIAGNOSIS — F0280 Dementia in other diseases classified elsewhere without behavioral disturbance: Secondary | ICD-10-CM | POA: Diagnosis not present

## 2014-12-30 DIAGNOSIS — G20 Parkinson's disease: Secondary | ICD-10-CM

## 2014-12-30 NOTE — Progress Notes (Signed)
[facility]  PATIENT: Joseph [name] DOB: [birth year]  REFERRING CLINICIAN:  HISTORY FROM: patient and son REASON FOR VISIT: follow up   HISTORICAL  CHIEF COMPLAINT:  Chief Complaint  Patient presents with  . Follow-up    Parksinon's Disease     HISTORY OF PRESENT ILLNESS:   UPDATE 12/30/14: Since last visit, has had 2 falls and ER visits. Memory, tremor and gait diff are persistent.  UPDATE 07/01/14: Since last visit, was in hospital for UTI and acute delirium (05/26/14 - 06/02/14). Now back to baseline. Still taking carb/levo, donepezil and seroquel. No reports of wearing off.   UPDATE 12/28/13: Since last visit, hallucinations, reality vs dream diff, continue. No threatening or frightening hallucinations. Tremor is stable. Getting daily therapy sessions.   UPDATE 08/25/13: Since last visit, having more stiffness, esp in legs, esp in early AM when getting out of bed. THis is intermittent, and random. More problems with urinary incontinence. Memory, hallucinations, paranoia, are stable.  UPDATE 05/01/12: Doing well. Better than last time. Behavior and throughts are better. Tremor is better. PT completed.  UPDATE 01/21/12: More problems with staff reporting agitation. Patient thinks that staff is "being rough". An example of conflict includes when the patient is woken up for bed and diaper changes. Patient feels like he is trapped in that they're being rough with him. The staff reports that he is agitated and belligerent. As a solution the staff has suggested for the patient to wear a condom catheter at night time although the patient does refuse. Staff is concerned about development of bed sores if he urinates in the bed without adequate hygiene. He still able walk and use a bedside commode without assistance. Patient's son also reports increasing problems with paranoid thoughts and conspiracy theories. One such example includes a story that the patient tells about Monia Sabal, being in an ambulance, and the plate numbers being switched with the patient's own ambulance. Patient thinks that he is seeing this on a TV program but someone is working to hide this. Patient also thinks that the staff at the facility is "running drugs" in the medicine cart.  PRIOR HPI: 74 year old right-handed male with history of Parkinson's disease, depression, here for evaluation. Patient was diagnosed Parkinson's disease 2007, after developing right upper extremity tremor.  Apparently he was treated with carbidopa levodopa, then comtan was added.  He was also on Topamax, for unclear reasons. May 2012 - patient's wife, who was his primary caregiver passed away. 2011-05-12- patient fell and had left hip fracture. He was treated with surgery.  Went to rehab, then moved to [place] to be closer to his son.  He was at [facility] inpatient rehabilitation, and now lives at [facility]. Since hip fracture, patient has had significant difficulty with ambulation. He is also been having trouble taking his medications due to complaint of headache. He also has intermittent hallucinations and confusion.  REVIEW OF SYSTEMS: Full 14 system review of systems performed and notable only for hearing loss doing double vision joint aching muscles wheelchair-bound confusion decreased concentration depression hallucination frequent UTIs difficulty urinating.   ALLERGIES: No Known Allergies  HOME MEDICATIONS: Outpatient Prescriptions Prior to Visit  Medication Sig Dispense Refill  . carbidopa-levodopa (SINEMET IR) 25-100 MG per tablet Take 1.5 tablets by mouth 3 (three) times daily. At 5am, 10am, 4pm 135 tablet 1  . docusate sodium (COLACE) 100 MG capsule Take 100 mg by mouth every morning.     . donepezil (  ARICEPT) 10 MG tablet Take 10 mg by mouth at bedtime.    . fluocinonide ointment (LIDEX) 0.05 % Apply 1 application topically daily.    . furosemide (LASIX) 20 MG tablet Take 10 mg by mouth  daily.     . hydrochlorothiazide (HYDRODIURIL) 25 MG tablet Take 12.5 mg by mouth every morning.     . Infant Care Products (BABY SHAMPOO EX) Apply topically See admin instructions. Provide and wash over eyelids and rinse with warm cloth twice daily    . loratadine (CLARITIN) 10 MG tablet Take 10 mg by mouth daily as needed for allergies.     Marland Kitchen LORazepam (ATIVAN) 0.5 MG tablet Take 0.5 tablets (0.25 mg total) by mouth 2 (two) times daily as needed for anxiety. 30 tablet 0  . miconazole (MICOTIN) 2 % cream Apply 1 application topically daily as needed (burning). Apply to buttocks.    . nystatin cream (MYCOSTATIN) Apply 1 application topically 2 (two) times daily. Apply to genital area    . potassium chloride SA (K-DUR,KLOR-CON) 20 MEQ tablet Take 20 mEq by mouth every morning.     . psyllium (REGULOID) 0.52 G capsule Take 0.52 g by mouth every morning.     Marland Kitchen QUEtiapine (SEROQUEL) 50 MG tablet Take 1 tablet (50 mg total) by mouth at bedtime. 30 tablet 0  . Skin Protectants, Misc. (BAZA PROTECT EX) Apply 1 application topically as needed (for burning).    . cephALEXin (KEFLEX) 500 MG capsule Take 1 capsule (500 mg total) by mouth 2 (two) times daily. 14 capsule 0  . ciprofloxacin (CIPRO) 500 MG tablet Take 1 tablet (500 mg total) by mouth 2 (two) times daily. X 2 days (Patient not taking: Reported on 08/16/2014) 4 tablet 0  . escitalopram (LEXAPRO) 20 MG tablet Take 1 tablet (20 mg total) by mouth every morning. 30 tablet 0   No facility-administered medications prior to visit.    PAST MEDICAL HISTORY: Past Medical History  Diagnosis Date  . Parkinson disease   . Seizures   . Depression   . Hypertension   . Dementia   . UTI (lower urinary tract infection)     PAST SURGICAL HISTORY: Past Surgical History  Procedure Laterality Date  . Hip fracture surgery      FAMILY HISTORY: Family History  Problem Relation Age of Onset  . Pancreatic cancer Mother   . Heart attack Father      SOCIAL HISTORY:  History   Social History  . Marital Status: Widowed    Spouse Name: N/A  . Number of Children: 2  . Years of Education: HS   Occupational History  . retired    Social History Main Topics  . Smoking status: Former Smoker    Quit date: 03/20/1963  . Smokeless tobacco: Never Used  . Alcohol Use: Yes  . Drug Use: No  . Sexual Activity: No   Other Topics Concern  . Not on file   Social History Narrative   Patient lives at home at [facility].   Caffeine Use: 5 cups of coffee a day      PHYSICAL EXAM  Filed Vitals:   12/30/14 1339  BP: 115/62  Pulse: 58    Not recorded      There is no weight on file to calculate BMI.  General: Patient is awake, alert and in no acute distress.  Well developed and groomed. Neck: Neck is supple. Cardiovascular: No carotid artery bruits.  Heart is regular rate and  rhythm with no murmurs.  Neurologic Exam  Mental Status: Awake, alert.  Language is fluent and comprehension intact.  PSYCHOMOTOR SLOWING.   Cranial Nerves: Pupils are equal and reactive to light.  Visual fields are full to confrontation.  Conjugate eye movements are full and symmetric.  Facial sensation and strength are symmetric. DECR HEARING.  Palate elevated symmetrically and uvula is midline.  Shoulder shrug is symmetric.  Tongue is midline. SOFT VOICE, SLURRED SPEECH. Motor: Normal bulk.  MINIMAL REST TREMOR IN RUE. MOD COGWHEELING AND BRADYKINESIA (LUE > RUE). SIGNIFICANT CONTRACTURES OF BLE HAMSTRINGS AND LEFT LEFT ADDUCTOR Sensory: ABSENT VIB AT TOES AND ANKLES. Coordination: No ataxia or dysmetria on finger-nose or rapid alternating movement testing. Gait and Station: IN [place]CHAIR.  UNABLE TO STAND. STOOPED POSTURE. Reflexes: Deep tendon reflexes in the upper and lower extremity are TRACE and symmetric.   DIAGNOSTIC DATA (LABS, IMAGING, TESTING) - I reviewed patient records, labs, notes, testing and imaging myself where available.  Lab  Results  Component Value Date   WBC 7.1 10/26/2014   HGB 13.9 10/26/2014   HCT 40.5 10/26/2014   MCV 90.2 10/26/2014   PLT 176 10/26/2014      Component Value Date/Time   NA 139 10/26/2014 1946   K 3.6 10/26/2014 1946   CL 105 10/26/2014 1946   CO2 29 10/26/2014 1946   GLUCOSE 103* 10/26/2014 1946   BUN 21 10/26/2014 1946   CREATININE 0.79 10/26/2014 1946   CALCIUM 9.1 10/26/2014 1946   PROT 6.3 10/26/2014 1946   ALBUMIN 3.6 10/26/2014 1946   AST 19 10/26/2014 1946   ALT 8 10/26/2014 1946   ALKPHOS 50 10/26/2014 1946   BILITOT 0.6 10/26/2014 1946   GFRNONAA 86* 10/26/2014 1946   GFRAA >90 10/26/2014 1946   No results found for: CHOL No results found for: HGBA1C Lab Results  Component Value Date   VITAMINB12 752 03/19/2013   Lab Results  Component Value Date   TSH 1.669 03/19/2013     ASSESSMENT AND PLAN  74 y.o. 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 Also with mild dementia and intermittent hallucinations (non-threatening). Progressive decline, infx and debility.   PLAN: - continue carbidopa/levodopa 1.5 tabs three times per day (5am, 10am, 4pm).  - recommend palliative care consult and family meeting to discuss goals of care, code status and MOST form - follow up in neurology as needed  Return if symptoms worsen or fail to improve, for return to PCP.  I spent 25 minutes of face to face time with patient. Greater than 50% of time was spent in counseling and coordination of care with patient. In summary we discussed advanced care planning, end of life issues, palliative care in addition to parkinsonism treatments.    Suanne MarkerVIKRAM R. Mia Winthrop, MD 12/30/2014, 2:22 PM Certified in Neurology, Neurophysiology and Neuroimaging  [facility] [address], [address] [place], [place] [id number] [phone]

## 2015-08-11 DEATH — deceased

## 2016-04-15 IMAGING — CR DG KNEE COMPLETE 4+V*L*
4 series · 4 of 4 positions shown · non-contrast
Comparison: 08/17/2013.

CLINICAL DATA: Fall todayLeft ankle and left shoulder pain.Abrasion
to ;eft anterior knee. Pt unable to extend legs; legs contracted; pt
does not walk.Bruising to lateral left shoulder.

EXAM:
LEFT KNEE - COMPLETE 4+ VIEW

[knee ap]
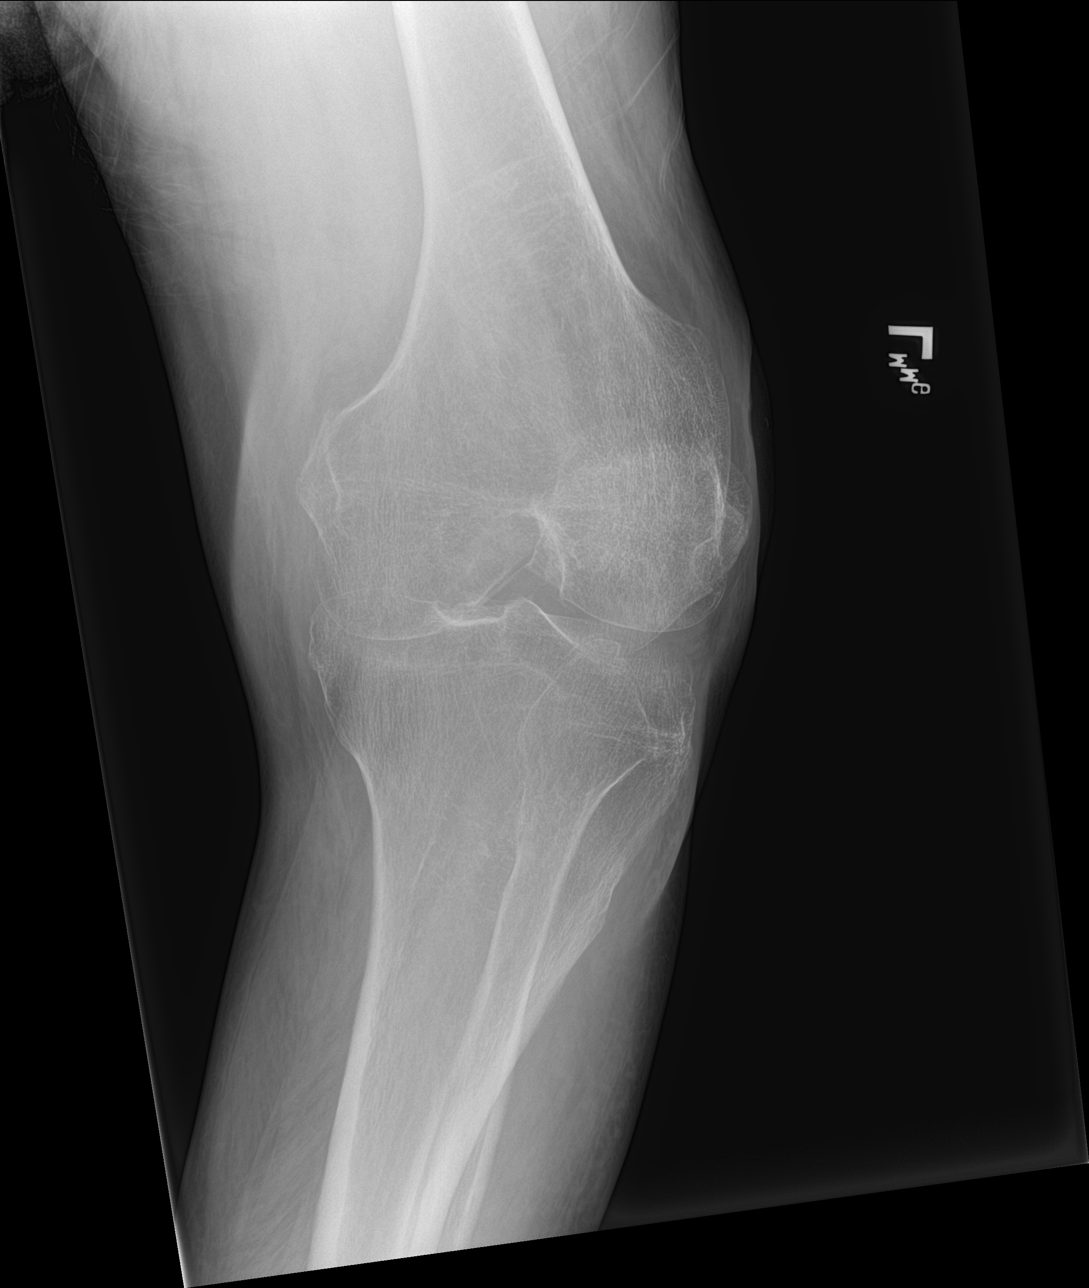

[knee obl (1 of 2)]
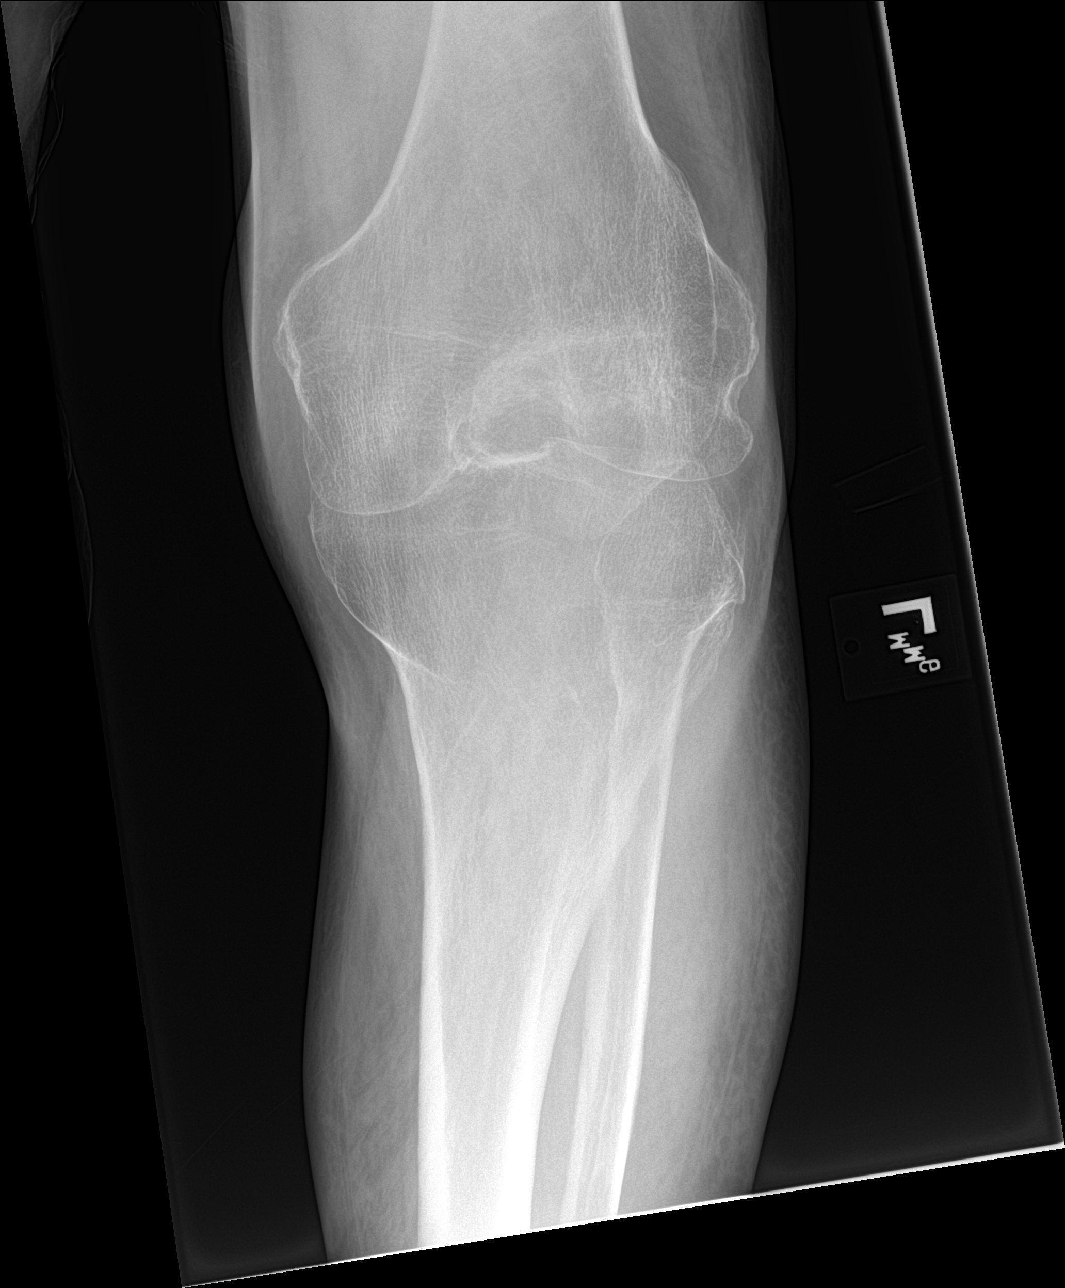

[knee obl (2 of 2)]
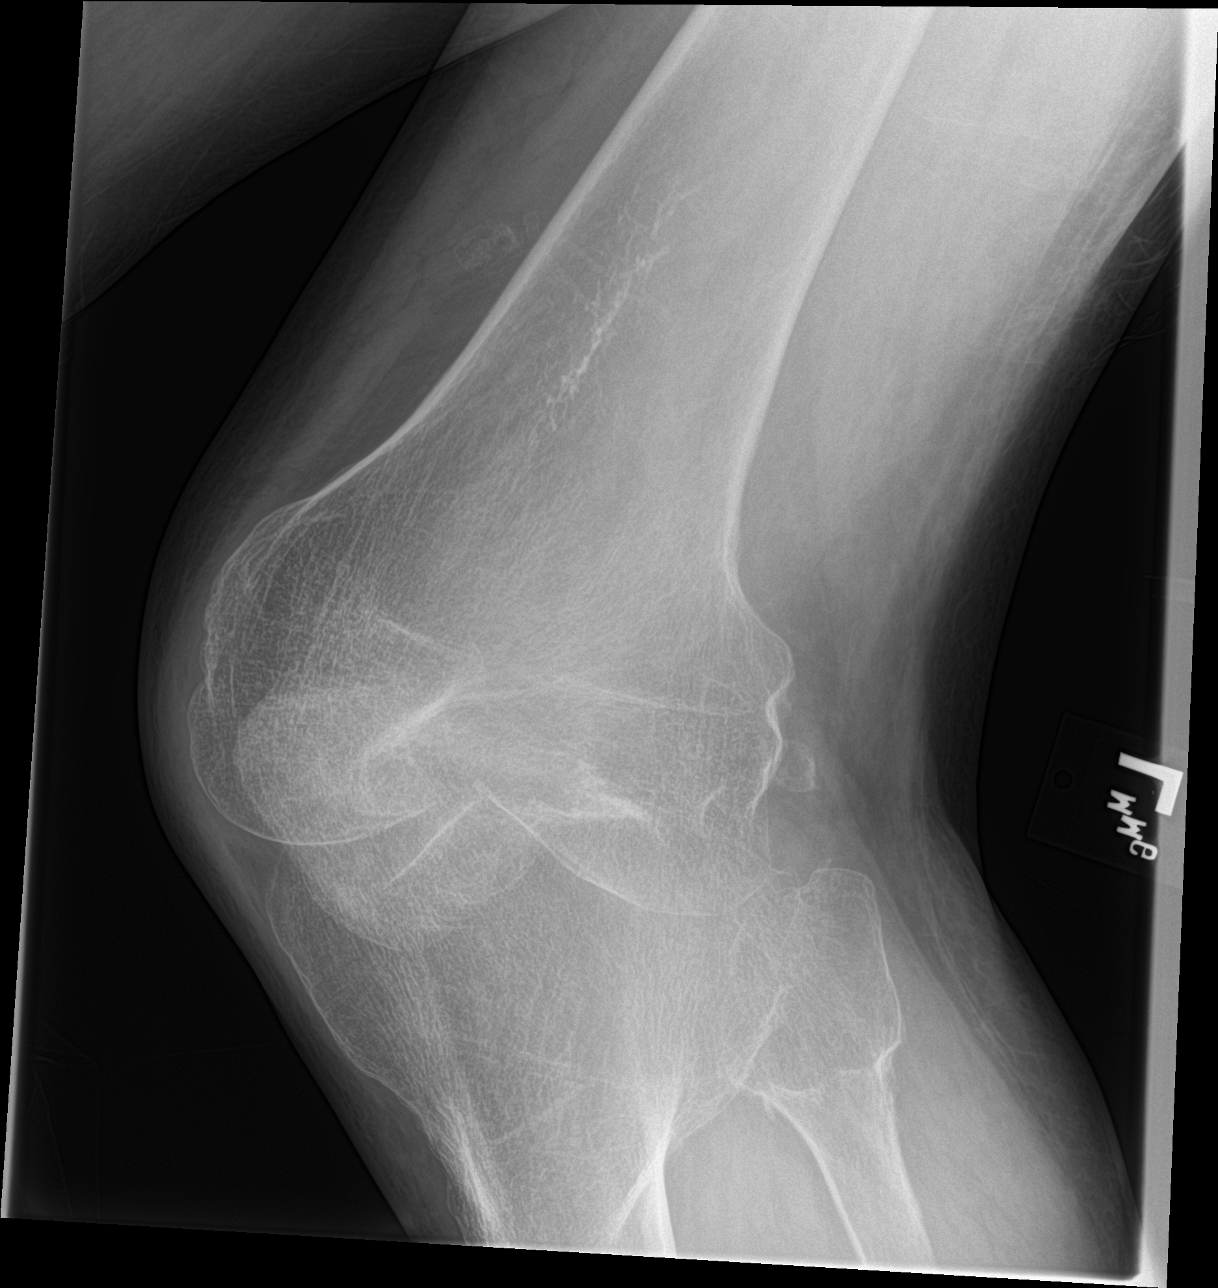

[knee lat]
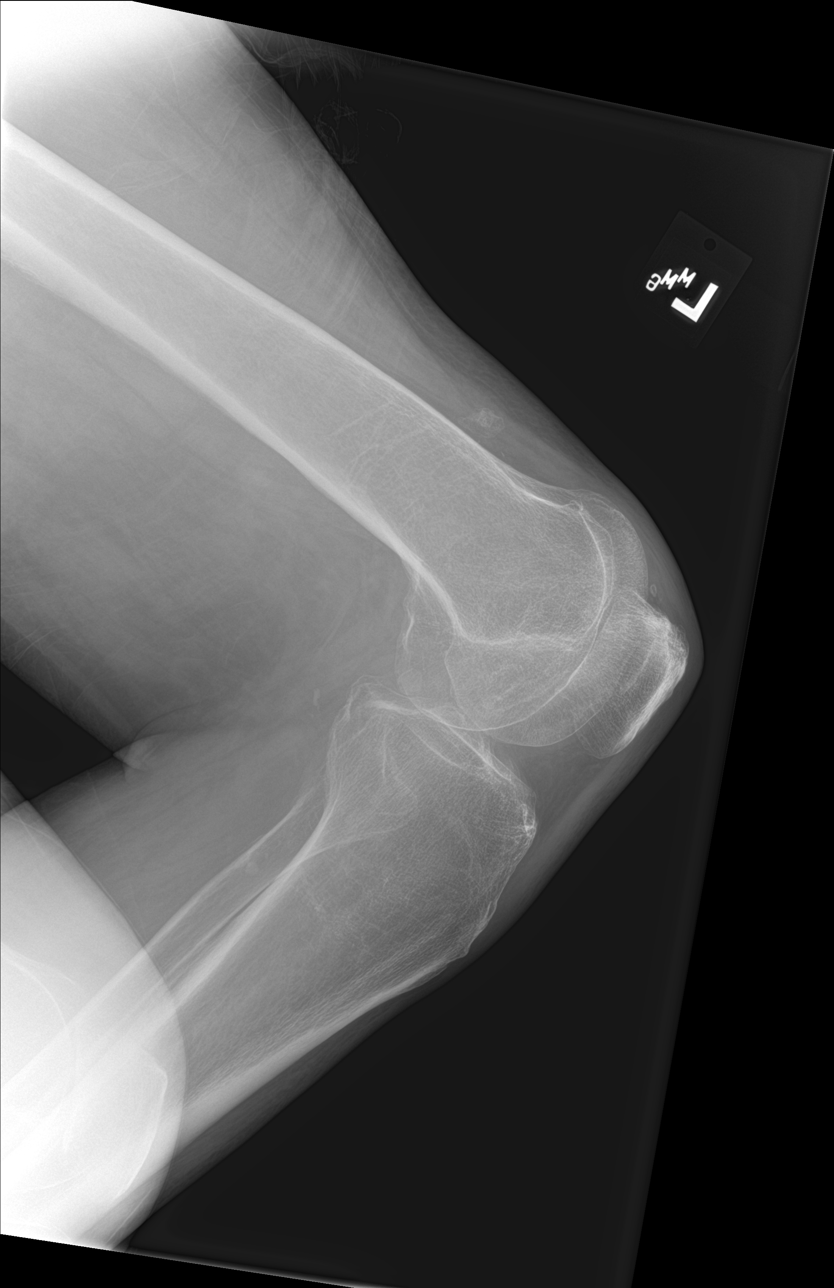

[4 of 4 positions shown; findings below may reference images not displayed]

FINDINGS: No convincing fracture. No bone lesion. Bones are diffusely
demineralized. Knee joint is normally aligned. No joint effusion.
IMPRESSION: No fracture or dislocation.  Stable appearance from the prior study.
